# Patient Record
Sex: Male | Born: 2000 | Race: Black or African American | Hispanic: No | Marital: Single | Smoking: Never smoker
Health system: Southern US, Community
[De-identification: ages and names within clinical notes are randomized; demographics above are authoritative.]

## PROBLEM LIST (undated history)

## (undated) ENCOUNTER — Emergency Department (HOSPITAL_BASED_OUTPATIENT_CLINIC_OR_DEPARTMENT_OTHER): Payer: Medicaid Other

## (undated) DIAGNOSIS — R9431 Abnormal electrocardiogram [ECG] [EKG]: Secondary | ICD-10-CM

---
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
## 2021-09-11 ENCOUNTER — Emergency Department (HOSPITAL_BASED_OUTPATIENT_CLINIC_OR_DEPARTMENT_OTHER): Payer: Medicaid Other | Admitting: Radiology

## 2021-09-11 ENCOUNTER — Observation Stay (HOSPITAL_BASED_OUTPATIENT_CLINIC_OR_DEPARTMENT_OTHER)
Admission: EM | Admit: 2021-09-11 | Discharge: 2021-09-12 | Disposition: A | Discharge status: Home / Self Care | Payer: Medicaid Other | Attending: Emergency Medicine | Admitting: Emergency Medicine

## 2021-09-11 ENCOUNTER — Encounter (HOSPITAL_BASED_OUTPATIENT_CLINIC_OR_DEPARTMENT_OTHER): Payer: Self-pay | Admitting: Obstetrics and Gynecology

## 2021-09-11 ENCOUNTER — Other Ambulatory Visit: Payer: Self-pay

## 2021-09-11 ENCOUNTER — Emergency Department (HOSPITAL_BASED_OUTPATIENT_CLINIC_OR_DEPARTMENT_OTHER): Payer: Medicaid Other

## 2021-09-11 DIAGNOSIS — Z20822 Contact with and (suspected) exposure to covid-19: Secondary | ICD-10-CM | POA: Diagnosis not present

## 2021-09-11 DIAGNOSIS — R9431 Abnormal electrocardiogram [ECG] [EKG]: Secondary | ICD-10-CM | POA: Diagnosis not present

## 2021-09-11 DIAGNOSIS — R002 Palpitations: Secondary | ICD-10-CM | POA: Diagnosis present

## 2021-09-11 DIAGNOSIS — R079 Chest pain, unspecified: Secondary | ICD-10-CM | POA: Diagnosis present

## 2021-09-11 DIAGNOSIS — K5900 Constipation, unspecified: Secondary | ICD-10-CM | POA: Diagnosis not present

## 2021-09-11 DIAGNOSIS — E876 Hypokalemia: Secondary | ICD-10-CM

## 2021-09-11 DIAGNOSIS — F064 Anxiety disorder due to known physiological condition: Secondary | ICD-10-CM | POA: Diagnosis present

## 2021-09-11 DIAGNOSIS — I517 Cardiomegaly: Secondary | ICD-10-CM | POA: Diagnosis present

## 2021-09-11 HISTORY — DX: Abnormal electrocardiogram (ECG) (EKG): R94.31

## 2021-09-11 LAB — BASIC METABOLIC PANEL
Anion gap: 9 (ref 5–15)
BUN: 13 mg/dL (ref 6–20)
CO2: 28 mmol/L (ref 22–32)
Calcium: 10 mg/dL (ref 8.9–10.3)
Chloride: 101 mmol/L (ref 98–111)
Creatinine, Ser: 1.17 mg/dL (ref 0.61–1.24)
GFR, Estimated: >60 mL/min (ref >60)
Glucose, Bld: 99 mg/dL (ref 70–99)
Potassium: 3.3 mmol/L — ABNORMAL LOW (ref 3.5–5.1)
Sodium: 138 mmol/L (ref 135–145)

## 2021-09-11 LAB — RESP PANEL BY RT-PCR (FLU A&B, COVID) ARPGX2
Influenza A by PCR: NEGATIVE
Influenza B by PCR: NEGATIVE
SARS Coronavirus 2 by RT PCR: NEGATIVE

## 2021-09-11 LAB — URINALYSIS, ROUTINE W REFLEX MICROSCOPIC
Bilirubin Urine: NEGATIVE
Glucose, UA: NEGATIVE mg/dL
Hgb urine dipstick: NEGATIVE
Ketones, ur: NEGATIVE mg/dL
Leukocytes,Ua: NEGATIVE
Nitrite: NEGATIVE
Protein, ur: NEGATIVE mg/dL
Specific Gravity, Urine: 1.012 (ref 1.005–1.030)
pH: 7 (ref 5.0–8.0)

## 2021-09-11 LAB — CBC
HCT: 44.1 % (ref 39.0–52.0)
Hemoglobin: 13.9 g/dL (ref 13.0–17.0)
MCH: 24.9 pg — ABNORMAL LOW (ref 26.0–34.0)
MCHC: 31.5 g/dL (ref 30.0–36.0)
MCV: 78.9 fL — ABNORMAL LOW (ref 80.0–100.0)
Platelets: 240 10*3/uL (ref 150–400)
RBC: 5.59 MIL/uL (ref 4.22–5.81)
RDW: 13.2 % (ref 11.5–15.5)
WBC: 7.7 10*3/uL (ref 4.0–10.5)
nRBC: 0 % (ref 0.0–0.2)

## 2021-09-11 LAB — MAGNESIUM: Magnesium: 1.8 mg/dL (ref 1.7–2.4)

## 2021-09-11 LAB — TROPONIN I (HIGH SENSITIVITY)
Troponin I (High Sensitivity): <2 ng/L (ref <18)
Troponin I (High Sensitivity): <2 ng/L (ref <18)

## 2021-09-11 LAB — BRAIN NATRIURETIC PEPTIDE: B Natriuretic Peptide: 5.5 pg/mL (ref 0.0–100.0)

## 2021-09-11 NOTE — ED Provider Notes (Signed)
[place] EMERGENCY DEPT Provider Note   CSN: [id number] Arrival date & time: 09/11/21  1900     History Chief Complaint  Patient presents with   Chest Pain   Constipation    Miguel Johnson is a 20 y.o. male.  The history is provided by the patient and medical records. No language interpreter was used.  Palpitations Palpitations quality:  Fast Onset quality:  Sudden Duration:  10 minutes Timing:  Intermittent Progression:  Waxing and waning Chronicity:  Recurrent Context: anxiety   Relieved by:  Nothing Worsened by:  Nothing Ineffective treatments:  None tried Associated symptoms: diaphoresis and malaise/fatigue   Associated symptoms: no back pain, no chest pain, no chest pressure, no cough, no dizziness, no lower extremity edema, no nausea, no near-syncope, no shortness of breath and no vomiting   Risk factors: no diabetes mellitus, no hx of atrial fibrillation, no hx of DVT, no hx of PE and no hx of thyroid disease       History reviewed. No pertinent past medical history.  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    Past Surgical History:  Procedure Laterality Date   KNEE ARTHROSCOPY WITH DRILLING/MICROFRACTURE Left 11/21/2018   Procedure: MICROFRACTURE;  Surgeon: Renette Butters, MD;  Location: [place];  Service: Orthopedics;  Laterality: Left;   KNEE ARTHROSCOPY WITH MENISCAL REPAIR Left 11/21/2018   Procedure: KNEE ARTHROSCOPY WITH MEDIAL MENISCAL REPAIR;  Surgeon: Renette Butters, MD;  Location: [place];  Service: Orthopedics;  Laterality: Left;   ORCHIOPEXY Right 11/26/2018   Procedure: BILATERAL ORCHIOPEXY PEDIATRIC RIGHT TESTICULAR EXPLORATION;  Surgeon: Stanford Scotland, MD;  Location: [place];  Service: Pediatrics;  Laterality: Right;       No family history on file.  Social History   Tobacco Use   Smoking status: Never    Passive exposure: Never    Smokeless tobacco: Never  Vaping Use   Vaping Use: Never used  Substance Use Topics   Alcohol use: Never   Drug use: Not Currently    Home Medications Prior to Admission medications   Medication Sig Start Date End Date Taking? Authorizing Provider  ibuprofen (ADVIL,MOTRIN) 600 MG tablet Take 1 tablet (600 mg total) by mouth every 8 (eight) hours as needed for moderate pain. For pain / inflammation. 11/21/18   Prudencio Burly III, PA-C  ondansetron (ZOFRAN) 4 MG tablet Take 1 tablet (4 mg total) by mouth every 8 (eight) hours as needed for nausea or vomiting. 11/21/18   Prudencio Burly III, PA-C    Allergies    Patient has no known allergies.  Review of Systems   Review of Systems  Constitutional:  Positive for diaphoresis and malaise/fatigue. Negative for chills, fatigue and fever.  HENT:  Negative for congestion.   Eyes:  Negative for visual disturbance.  Respiratory:  Negative for cough, chest tightness, shortness of breath and wheezing.   Cardiovascular:  Positive for palpitations. Negative for chest pain, leg swelling and near-syncope.  Gastrointestinal:  Positive for constipation. Negative for abdominal pain, diarrhea, nausea and vomiting.  Genitourinary:  Negative for dysuria and flank pain.  Musculoskeletal:  Negative for back pain.  Neurological:  Positive for light-headedness. Negative for dizziness and headaches.  Psychiatric/Behavioral:  Negative for agitation and confusion.   All other systems reviewed and are negative.  Physical Exam Updated Vital Signs BP (!) 173/82   Pulse (!) 106   Temp  98.8 F (37.1 C)   Resp 16   Ht 6\' 1"  (1.854 m)   Wt 74.8 kg   SpO2 100%   BMI 21.76 kg/m   Physical Exam Vitals and nursing note reviewed.  Constitutional:      General: He is not in acute distress.    Appearance: He is well-developed. He is not ill-appearing, toxic-appearing or diaphoretic.  HENT:     Head: Normocephalic and atraumatic.     Nose:  No congestion or rhinorrhea.     Mouth/Throat:     Mouth: Mucous membranes are moist.     Pharynx: No oropharyngeal exudate or posterior oropharyngeal erythema.  Eyes:     Conjunctiva/sclera: Conjunctivae normal.  Cardiovascular:     Rate and Rhythm: Regular rhythm. Tachycardia present.     Heart sounds: No murmur heard. Pulmonary:     Effort: Pulmonary effort is normal. No respiratory distress.     Breath sounds: Normal breath sounds.  Abdominal:     Palpations: Abdomen is soft.     Tenderness: There is no abdominal tenderness. There is no guarding or rebound.  Musculoskeletal:        General: No tenderness.     Cervical back: Neck supple. No tenderness.  Skin:    General: Skin is warm and dry.     Capillary Refill: Capillary refill takes less than 2 seconds.     Findings: No erythema.  Neurological:     General: No focal deficit present.     Mental Status: He is alert.  Psychiatric:        Mood and Affect: Mood normal.    ED Results / Procedures / Treatments   Labs (all labs ordered are listed, but only abnormal results are displayed) Labs Reviewed  BASIC METABOLIC PANEL - Abnormal; Notable for the following components:      Result Value   Potassium 3.3 (*)    All other components within normal limits  CBC - Abnormal; Notable for the following components:   MCV 78.9 (*)    MCH 24.9 (*)    All other components within normal limits  URINALYSIS, ROUTINE W REFLEX MICROSCOPIC - Abnormal; Notable for the following components:   Color, Urine COLORLESS (*)    All other components within normal limits  RESP PANEL BY RT-PCR (FLU A&B, COVID) ARPGX2  BRAIN NATRIURETIC PEPTIDE  MAGNESIUM  TSH  TROPONIN I (HIGH SENSITIVITY)  TROPONIN I (HIGH SENSITIVITY)    EKG EKG Interpretation  Date/Time:  Monday September 11 2021 19:18:02 EST Ventricular Rate:  91 PR Interval:  152 QRS Duration: 112 QT Interval:  380 QTC Calculation: 467 R Axis:   84 Text Interpretation: Sinus  rhythm with marked sinus arrhythmia Abnormal ECG No STEMI per cardiology who reviewed with me on phone. Confirmed by 10-05-1989 (Theda Belfast) on 09/11/2021 8:56:21 PM  Radiology DG Chest Port 1 View  Result Date: 09/11/2021 CLINICAL DATA:  Palpitations EXAM: PORTABLE CHEST 1 VIEW COMPARISON:  None. FINDINGS: The heart size and mediastinal contours are within normal limits. Both lungs are clear. The visualized skeletal structures are unremarkable. IMPRESSION: No active disease. Electronically Signed   By: 09/13/2021 M.D.   On: 09/11/2021 19:51    Procedures Procedures   Medications Ordered in ED Medications - No data to display  ED Course  I have reviewed the triage vital signs and the nursing notes.  Pertinent labs & imaging results that were available during my care of the patient were reviewed by me  and considered in my medical decision making (see chart for details).    MDM Rules/Calculators/A&P                           SHYON SLAGTER is a 20 y.o. male with no significant past medical history who presents with palpitations.  According to patient, for the last month he has been having the episodes of palpitations frequently.  He reports it can last for several minutes at a time and mother reports that he has been having episodes of diaphoresis and fatigue with it.  Patient denies any chest pain but reports it "feels weird" when he is having them.  He denies any drug use, alcohol use, or medications.  They do say that a uncle of the patient passed away early from MI.  Patient denies any fevers, chills congestion, cough, nausea, vomiting, constipation, diarrhea, or urinary changes peer denies any leg pain or leg swelling.  On arrival, he had EKG that was abnormal and concerning.  I quickly called the STEMI cardiologist to review the EKG as it did not appear normal.  The cardiologist, Dr. Irish Lack, expressed concern that the EKG was in the normal but he did not suspect MI or STEMI at  this time.  He suspects the patient may have some other congenital cardiac problem like hypertrophic cardiomyopathy and he is concerned that the patient is having episodes of fast palpitations lightheadedness, and diaphoresis.  He does feel that after work-up was completed in the emergency department, he would benefit from admission to the cardiology service for echo and further monitoring and evaluation.  Will speak with the family and offer admission based on the cardiology recommendations based on the EKG.  Family agrees and cardiology agrees with admission.  Cardiology will admit for further work-up and management.  Final Clinical Impression(s) / ED Diagnoses Final diagnoses:  Abnormal ECG  Palpitations    Clinical Impression: 1. Abnormal ECG   2. Chest pain   3. Palpitations     Disposition: Admit  This note was prepared with assistance of Dragon voice recognition software. Occasional wrong-word or sound-a-like substitutions may have occurred due to the inherent limitations of voice recognition software.     Niccolas Loeper, Gwenyth Allegra, MD 09/12/21 [phone]

## 2021-09-11 NOTE — ED Notes (Signed)
Care Handoff given to Augusto Gamble, RN at this Time. All Questions Answered.

## 2021-09-11 NOTE — ED Triage Notes (Signed)
Patient reports he has constipation and palpitations x2 months. Patient reports his last BM was today.

## 2021-09-12 ENCOUNTER — Encounter (HOSPITAL_COMMUNITY): Payer: Self-pay | Admitting: Student in an Organized Health Care Education/Training Program

## 2021-09-12 ENCOUNTER — Inpatient Hospital Stay (HOSPITAL_COMMUNITY): Payer: Medicaid Other

## 2021-09-12 DIAGNOSIS — R9431 Abnormal electrocardiogram [ECG] [EKG]: Secondary | ICD-10-CM

## 2021-09-12 DIAGNOSIS — Z20822 Contact with and (suspected) exposure to covid-19: Secondary | ICD-10-CM | POA: Diagnosis not present

## 2021-09-12 DIAGNOSIS — E876 Hypokalemia: Secondary | ICD-10-CM

## 2021-09-12 DIAGNOSIS — R002 Palpitations: Secondary | ICD-10-CM | POA: Diagnosis not present

## 2021-09-12 DIAGNOSIS — R000 Tachycardia, unspecified: Secondary | ICD-10-CM | POA: Diagnosis not present

## 2021-09-12 DIAGNOSIS — K5900 Constipation, unspecified: Secondary | ICD-10-CM | POA: Diagnosis not present

## 2021-09-12 LAB — ECHOCARDIOGRAM COMPLETE
Area-P 1/2: 2.26 cm2
Calc EF: 55.7 %
Height: 73 in
S' Lateral: 3.5 cm
Single Plane A2C EF: 56.7 %
Single Plane A4C EF: 58.1 %
Weight: 2596.14 oz

## 2021-09-12 LAB — HIV ANTIBODY (ROUTINE TESTING W REFLEX): HIV Screen 4th Generation wRfx: NONREACTIVE

## 2021-09-12 LAB — TSH: TSH: 0.864 u[IU]/mL (ref 0.350–4.500)

## 2021-09-12 MED ORDER — ENSURE ENLIVE PO LIQD
237.0000 mL | Freq: Two times a day (BID) | ORAL | Status: DC
  Administered 2021-09-12: 237 mL via ORAL

## 2021-09-12 MED ORDER — POLYETHYLENE GLYCOL 3350 17 G PO PACK
17.0000 g | PACK | Freq: Every day | ORAL | Status: DC
  Administered 2021-09-12: 17 g via ORAL
  Filled 2021-09-12: qty 1

## 2021-09-12 MED ORDER — SENNOSIDES-DOCUSATE SODIUM 8.6-50 MG PO TABS
1.0000 | ORAL_TABLET | Freq: Two times a day (BID) | ORAL | Status: DC
  Administered 2021-09-12: 1 via ORAL
  Filled 2021-09-12: qty 1

## 2021-09-12 MED ORDER — POTASSIUM CHLORIDE CRYS ER 20 MEQ PO TBCR
40.0000 meq | EXTENDED_RELEASE_TABLET | Freq: Once | ORAL | Status: AC
  Administered 2021-09-12: 40 meq via ORAL
  Filled 2021-09-12: qty 2

## 2021-09-12 MED ORDER — ADULT MULTIVITAMIN W/MINERALS CH
1.0000 | ORAL_TABLET | Freq: Every day | ORAL | Status: DC
  Administered 2021-09-12: 1 via ORAL
  Filled 2021-09-12: qty 1

## 2021-09-12 NOTE — Progress Notes (Signed)
Initial Nutrition Assessment  DOCUMENTATION CODES:  Not applicable  INTERVENTION:  Continue Ensure BID.  Add Magic cup TID with meals, each supplement provides 290 kcal and 9 grams of protein.  Add MVI with minerals daily.  Encourage PO and supplement intake.  NUTRITION DIAGNOSIS:  Inadequate oral intake related to decreased appetite as evidenced by per patient/family report.  GOAL:  Patient will meet greater than or equal to 90% of their needs  MONITOR:  PO intake, Supplement acceptance, Labs, Weight trends, Skin, I & O's  REASON FOR ASSESSMENT:  Malnutrition Screening Tool    ASSESSMENT:  20 yo male with no significant PMH who is admitted for evaluation of palpitations and LVH on ECG.  Spoke with pt at bedside, along with his mother. Pt reports that his appetite has been a little less recently and he has been losing weight because he has not been eating as much. Mother confirmed this.  Mother mentioned rash on patient's chest. RD took a look at mother's request. They appear to be white bumps that per mother, "come and go." Recommended dermatologist for care.  Pt endorses some weight loss, but is unsure of how much and over how long.   Per Epic, pt has lost ~13 lbs (7.3%) in the last 13 months, which is not necessarily significant for the time frame.  He reports disliking the hospital food, so encouraged intake of Ensure and suggested Magic Cup. Also recommend MVI with minerals daily.  Supplements: Ensure BID  Medications: reviewed; miralax, Klor-Con 40 mEq once, Senokot BID  Labs: reviewed; K 3.3 (L)  NUTRITION - FOCUSED PHYSICAL EXAM: Flowsheet Row Most Recent Value  Orbital Region No depletion  Upper Arm Region No depletion  Thoracic and Lumbar Region No depletion  Buccal Region No depletion  Temple Region No depletion  Clavicle Bone Region No depletion  Clavicle and Acromion Bone Region No depletion  Scapular Bone Region No depletion  Dorsal Hand No  depletion  Patellar Region No depletion  Anterior Thigh Region No depletion  Posterior Calf Region Mild depletion  Edema (RD Assessment) None  Hair Reviewed  Eyes Reviewed  Mouth Reviewed  Skin Reviewed  Nails Reviewed   Diet Order:   Diet Order             Diet Heart Room service appropriate? Yes; Fluid consistency: Thin  Diet effective now                  EDUCATION NEEDS:  Education needs have been addressed  Skin:  Skin Assessment: Reviewed RN Assessment  Last BM:  09/11/21  Height:  Ht Readings from Last 1 Encounters:  09/12/21 6\' 1"  (1.854 m) (89 %, Z= 1.24)*   * Growth percentiles are based on CDC (Boys, 2-20 Years) data.   Weight:  Wt Readings from Last 1 Encounters:  09/12/21 73.6 kg (65 %, Z= 0.37)*   * Growth percentiles are based on CDC (Boys, 2-20 Years) data.   BMI:  Body mass index is 21.41 kg/m.  Estimated Nutritional Needs:  Kcal:  2300-2500 Protein:  95-110 grams Fluid:  >2.3 L  09/14/21, RD, LDN (she/her/hers) Clinical Inpatient Dietitian RD Pager/After-Hours/Weekend Pager # in [place]

## 2021-09-12 NOTE — Progress Notes (Signed)
Stat EKG done per provider order, EKG showed STEMI , patient asymptomatic. Md made aware.     09/12/21 0424  Vitals  Temp 97.8 F (36.6 C)  Temp Source Oral  BP 134/82  MAP (mmHg) 97  BP Location Left Arm  BP Method Automatic  Patient Position (if appropriate) Lying  Pulse Rate 67  Pulse Rate Source Monitor  Resp 20

## 2021-09-12 NOTE — Plan of Care (Signed)

## 2021-09-12 NOTE — Progress Notes (Signed)
  Cross cover note: See fellow H/P this AM. 2D echo pending for this morning. K  3.3 on admission - not yet repleted, will give x1. EKG remains similar to admit tracing -> troponins negative and BNP wnl. Telemetry shows NSR/SB 50s-60s. Await MD review as well. Ferrah Panagopoulos PA-C

## 2021-09-12 NOTE — Progress Notes (Signed)
    Subjective:  Denies SSCP, palpitations or Dyspnea   Objective:  Vitals:   09/12/21 0215 09/12/21 0306 09/12/21 0424 09/12/21 0741  BP: 116/71 137/73 134/82 128/70  Pulse: 60 77 67 (!) 44  Resp: 13 16 20    Temp: 98.3 F (36.8 C) 98.5 F (36.9 C) 97.8 F (36.6 C) 97.9 F (36.6 C)  TempSrc: Oral Oral Oral Oral  SpO2: 99% 100% 100% 100%  Weight:  73.6 kg    Height:  6\' 1"  (1.854 m)      Intake/Output from previous day:  Intake/Output Summary (Last 24 hours) at 09/12/2021 0854 Last data filed at 09/12/2021 0500 Gross per 24 hour  Intake 480 ml  Output --  Net 480 ml    Physical Exam: Affect appropriate Thin black male  HEENT: normal Neck supple with no adenopathy JVP normal no bruits no thyromegaly Lungs clear with no wheezing and good diaphragmatic motion Heart:  S1/S2 no murmur, no rub, gallop or click PMI normal Abdomen: benighn, BS positve, no tenderness, no AAA no bruit.  No HSM or HJR Distal pulses intact with no bruits No edema Neuro non-focal Skin warm and dry No muscular weakness   Lab Results: Basic Metabolic Panel: Recent Labs    09/11/21 1924  NA 138  K 3.3*  CL 101  CO2 28  GLUCOSE 99  BUN 13  CREATININE 1.17  CALCIUM 10.0  MG 1.8   Liver Function Tests: No results for input(s): AST, ALT, ALKPHOS, BILITOT, PROT, ALBUMIN in the last 72 hours. No results for input(s): LIPASE, AMYLASE in the last 72 hours. CBC: Recent Labs    09/11/21 1924  WBC 7.7  HGB 13.9  HCT 44.1  MCV 78.9*  PLT 240    Thyroid Function Tests: Recent Labs    09/11/21 1924  TSH 0.864   Anemia Panel: No results for input(s): VITAMINB12, FOLATE, FERRITIN, TIBC, IRON, RETICCTPCT in the last 72 hours.  Imaging: DG Chest Port 1 View  Result Date: 09/11/2021 CLINICAL DATA:  Palpitations EXAM: PORTABLE CHEST 1 VIEW COMPARISON:  None. FINDINGS: The heart size and mediastinal contours are within normal limits. Both lungs are clear. The visualized skeletal  structures are unremarkable. IMPRESSION: No active disease. Electronically Signed   By: 09/13/21 M.D.   On: 09/11/2021 19:51    Cardiac Studies:  ECG: SR early repolarization likely normal variant no old ECG to compare   Telemetry:  Palpitations   Echo:   Medications:    feeding supplement  237 mL Oral BID BM   polyethylene glycol  17 g Oral Daily   potassium chloride  40 mEq Oral Once   senna-docusate  1 tablet Oral BID      Assessment/Plan:   Abnormal ECG:  likely normal variant for patient TTE pending to r/o HOCM Normal exam and cardiac [place] on CXR No high risk family history per patient and mother Will not need further w/u if echo normal  Charlett Nose 09/12/2021, 8:54 AM

## 2021-09-12 NOTE — Plan of Care (Signed)

## 2021-09-12 NOTE — H&P (Signed)
Cardiology Admission History and Physical:   Patient ID: Miguel Johnson MRN: [id number]; DOB: 2001-02-24   Admission date: 09/11/2021  PCP:  Venia Minks Jodelle Gross, MD   [facility] Providers Cardiologist:  None        Chief Complaint: Constipation  Patient Profile:   Miguel Johnson is a 20 y.o. male with no significant past medical history who is being seen 09/12/2021 for the evaluation of palpitations and LVH on ECG.  History of Present Illness:   Miguel Johnson the patient reports that he has been dealing with constipation off and on for a couple weeks which was actually the main reason why he presented to the outside ED.  However, for the past month he has been having episodes of palpitations that he attributes to "anxiety."  He states that these episodes come on spontaneously and last for a few seconds to a couple minutes before subsiding.  These episodes occur 1-2 times daily over the past month.  There are no appreciable triggers; however, he does endorse having increased anxiety due to life stressors.  He denies associated chest pain, shortness of breath, syncope, presyncope, weakness, numbness, nausea, vomiting, diaphoresis, or urinary symptoms.  On arrival to the ED he shared that he had been having these palpitations with the ED provider and EKG was obtained demonstrating ST segment elevations and marked LVH.  The overnight interventionalists was notified and did not feel this represented a STEMI; however, given the peculiarity of the EKG he was transferred to [facility] for observation.  Of note the patient denies having any family history of sudden cardiac death or cardiovascular disease in young family members.  His mother's uncle did die of an MI however at an older age.  His labs on admission were unremarkable.   History reviewed. No pertinent past medical history.  Past Surgical History:  Procedure Laterality Date   KNEE ARTHROSCOPY WITH DRILLING/MICROFRACTURE Left  11/21/2018   Procedure: MICROFRACTURE;  Surgeon: Sheral Apley, MD;  Location: [facility];  Service: Orthopedics;  Laterality: Left;   KNEE ARTHROSCOPY WITH MENISCAL REPAIR Left 11/21/2018   Procedure: KNEE ARTHROSCOPY WITH MEDIAL MENISCAL REPAIR;  Surgeon: Sheral Apley, MD;  Location: [facility];  Service: Orthopedics;  Laterality: Left;   ORCHIOPEXY Right 11/26/2018   Procedure: BILATERAL ORCHIOPEXY PEDIATRIC RIGHT TESTICULAR EXPLORATION;  Surgeon: Kandice Hams, MD;  Location: [facility] OR;  Service: Pediatrics;  Laterality: Right;     Medications Prior to Admission: Prior to Admission medications   Medication Sig Start Date End Date Taking? Authorizing Provider  ibuprofen (ADVIL,MOTRIN) 600 MG tablet Take 1 tablet (600 mg total) by mouth every 8 (eight) hours as needed for moderate pain. For pain / inflammation. 11/21/18   Albina Billet III, PA-C  ondansetron (ZOFRAN) 4 MG tablet Take 1 tablet (4 mg total) by mouth every 8 (eight) hours as needed for nausea or vomiting. 11/21/18   Albina Billet III, PA-C     Allergies:   No Known Allergies  Social History:   Social History   Socioeconomic History   Marital status: Single    Spouse name: Not on file   Number of children: Not on file   Years of education: Not on file   Highest education level: Not on file  Occupational History   Not on file  Tobacco Use   Smoking status: Never    Passive exposure: Never   Smokeless tobacco: Never  Vaping Use   Vaping  Use: Never used  Substance and Sexual Activity   Alcohol use: Never   Drug use: Not Currently   Sexual activity: Yes  Other Topics Concern   Not on file  Social History Narrative   Not on file   Social Determinants of Health   Financial Resource Strain: Not on file  Food Insecurity: Not on file  Transportation Needs: Not on file  Physical Activity: Not on file  Stress: Not on file  Social Connections: Not on file   Intimate Partner Violence: Not on file    Family History:   The patient's family history is not on file.    ROS:  Please see the history of present illness.  All other ROS reviewed and negative.     Physical Exam/Data:   Vitals:   09/11/21 2345 09/12/21 0100 09/12/21 0215 09/12/21 0306  BP: 122/62 (!) 144/89 116/71 137/73  Pulse: (!) 54 68 60 77  Resp: 18 16 13 16   Temp: 98.3 F (36.8 C)  98.3 F (36.8 C) 98.5 F (36.9 C)  TempSrc: Oral  Oral Oral  SpO2: 100% 100% 99% 100%  Weight:    73.6 kg  Height:    6\' 1"  (1.854 m)    Intake/Output Summary (Last 24 hours) at 09/12/2021 0402 Last data filed at 09/12/2021 0300 Gross per 24 hour  Intake 240 ml  Output --  Net 240 ml   Last 3 Weights 09/12/2021 09/11/2021 08/08/2020  Weight (lbs) 162 lb 4.1 oz 164 lb 14.5 oz 175 lb 7.8 oz  Weight (kg) 73.6 kg 74.8 kg 79.6 kg     Body mass index is 21.41 kg/m.  General:  Well nourished, well developed, in no acute distress HEENT: normal Neck: no JVD Vascular: No carotid bruits; Distal pulses 2+ bilaterally   Cardiac:  normal S1, S2; RRR; no murmur  Lungs:  clear to auscultation bilaterally, no wheezing, rhonchi or rales  Abd: soft, nontender, no hepatomegaly  Ext: no edema Musculoskeletal:  No deformities, BUE and BLE strength normal and equal Skin: warm and dry  Neuro:  CNs 2-12 intact, no focal abnormalities noted Psych:  Normal affect    EKG:  The ECG that was done  was personally reviewed and demonstrates marked LVH with diffuse ST segment elevations    Relevant CV Studies:  N/A  Laboratory Data:  High Sensitivity Troponin:   Recent Labs  Lab 09/11/21 1924 09/11/21 2208  TROPONINIHS <2 <2      Chemistry Recent Labs  Lab 09/11/21 1924  NA 138  K 3.3*  CL 101  CO2 28  GLUCOSE 99  BUN 13  CREATININE 1.17  CALCIUM 10.0  MG 1.8  GFRNONAA >60  ANIONGAP 9    No results for input(s): PROT, ALBUMIN, AST, ALT, ALKPHOS, BILITOT in the last 168  hours. Lipids No results for input(s): CHOL, TRIG, HDL, LABVLDL, LDLCALC, CHOLHDL in the last 168 hours. Hematology Recent Labs  Lab 09/11/21 1924  WBC 7.7  RBC 5.59  HGB 13.9  HCT 44.1  MCV 78.9*  MCH 24.9*  MCHC 31.5  RDW 13.2  PLT 240   Thyroid  Recent Labs  Lab 09/11/21 1924  TSH 0.864   BNP Recent Labs  Lab 09/11/21 1924  BNP 5.5    DDimer No results for input(s): DDIMER in the last 168 hours.   Radiology/Studies:  DG Chest Port 1 View  Result Date: 09/11/2021 CLINICAL DATA:  Palpitations EXAM: PORTABLE CHEST 1 VIEW COMPARISON:  None. FINDINGS: The  heart size and mediastinal contours are within normal limits. Both lungs are clear. The visualized skeletal structures are unremarkable. IMPRESSION: No active disease. Electronically Signed   By: Charlett Nose M.D.   On: 09/11/2021 19:51     Assessment and Plan:   Miguel Johnson is a 20 y.o. male with no significant past medical history who is being seen 09/12/2021 for the evaluation of palpitations and LVH on ECG.  #Palpitations :: Patient reports having intermittent palpitations up to twice daily over the past 1 month.  Unclear if this represents an SVT or anxiety as the patient endorses.  I think is reasonable for [place] to monitor him on telemetry overnight and then send him home with a Holter monitor for ongoing evaluation.  Currently stable and no other concerns from rhythm standpoint. -Holter monitor at discharge   #Athletes Heart vs. HOCM :: His EKG has marked LVH and repolarization abnormalities with diffuse ST elevations.  I think that this represents an EKG reflective of an athlete's heart given that he is young, athletic, and has no family history of early onset SCD to make me think that he is at risk for HOCM.  Nonetheless, given the findings on his EKG and his palpitations, it is certainly reasonable to get a TTE to assess for HOCM or some structural abnormality.  We will get a TTE today and remain on  telemetry as above. -TTE  #Constipation -Start Senokot twice daily and MiraLAX   Risk Assessment/Risk Scores:           Severity of Illness: The appropriate patient status for this patient is OBSERVATION. Observation status is judged to be reasonable and necessary in order to provide the required intensity of service to ensure the patient's safety. The patient's presenting symptoms, physical exam findings, and initial radiographic and laboratory data in the context of their medical condition is felt to place them at decreased risk for further clinical deterioration. Furthermore, it is anticipated that the patient will be medically stable for discharge from the hospital within 2 midnights of admission.    For questions or updates, please contact [facility] Please consult [email or web address] for contact info under     Signed, Karl Ito, MD  09/12/2021 4:02 AM

## 2021-09-12 NOTE — Discharge Summary (Signed)
Discharge Summary    Patient ID: TRENTIN Johnson MRN: [id number]; DOB: 11/20/2000  Admit date: 09/11/2021 Discharge date: 09/12/2021  PCP:  Ether Griffins, MD   [facility] Providers Cardiologist:  Charlton Haws, MD        Discharge Diagnoses    Principal Problem:   Nonspecific abnormal electrocardiogram (ECG) (EKG) Active Problems:   Palpitations   Constipation   Hypokalemia    Diagnostic Studies/Procedures    2D echo formal read pending. Per Dr. Fabio Bering preliminary read: no significant abnormalities. Report to follow. _____________   History of Present Illness     Miguel Johnson is a 20 y.o. male with no significant PMH who presented initially to [facility] with intermittent constipation on and off the last few months. He was incidentally found to have an abnormal EKG. He had reported episodic palpitations that he attributed to "anxiety."  He stated that these episodes would come on spontaneously and last for a few seconds to a couple minutes before subsiding.  These episodes would occur 1-2 times daily over the past month. There were no appreciable triggers; however, he did endorse having increased anxiety due to life stressors. He denied associated chest pain, shortness of breath, syncope, presyncope, weakness, numbness, nausea, vomiting, diaphoresis, or urinary symptoms. On arrival to the ED he shared that he had been having these palpitations with the ED provider and EKG was obtained demonstrating ST segment elevations and marked LVH.  The overnight interventionalists was notified and did not feel this represented a STEMI; however, given the peculiarity of the EKG he was transferred to [facility] for observation. The patient denied having any family history of sudden cardiac death or cardiovascular disease in young family members. His mother's uncle did die of an MI however at an older age. His labs on admission were unremarkable except for mildly low potassium  level of 3.3, prompting oral repletion.  Hospital Course     He was admitted to the cardiology service for further observation. Troponins were negative. Telemetry remained completely unremarkable overnight, normal sinus rhythm. 2D echocardiogram was obtained which Dr. Eden Emms reviewed and felt did not show any significant abnormalities. Formal report pending. He did not feel any further cardiac testing or follow-up was needed at this time. He did not feel palpitations required further workup given normal telemetry. The patient was encouraged to f/u primary care. Dr. Eden Emms has seen and examined the patient today and feels he is stable for discharge.    Did the patient have an acute coronary syndrome (MI, NSTEMI, STEMI, etc) this admission?:  No                               Did the patient have a percutaneous coronary intervention (stent / angioplasty)?:  No.      _____________  Discharge Vitals Blood pressure 128/70, pulse - listed as 44 in VS but last HR 62bpm - temperature 97.9 F (36.6 C), temperature source Oral, resp. rate 20, height 6\' 1"  (1.854 m), weight 73.6 kg, SpO2 100 %.  Filed Weights   09/11/21 1914 09/12/21 0306  Weight: 74.8 kg 73.6 kg    Labs & Radiologic Studies    CBC Recent Labs    09/11/21 1924  WBC 7.7  HGB 13.9  HCT 44.1  MCV 78.9*  PLT 240   Basic Metabolic Panel Recent Labs    09/13/21 1924  NA 138  K 3.3*  CL  101  CO2 28  GLUCOSE 99  BUN 13  CREATININE 1.17  CALCIUM 10.0  MG 1.8   High Sensitivity Troponin:   Recent Labs  Lab 09/11/21 1924 09/11/21 2208  TROPONINIHS <2 <2    Thyroid Function Tests Recent Labs    09/11/21 1924  TSH 0.864   _____________  DG Chest Port 1 View  Result Date: 09/11/2021 CLINICAL DATA:  Palpitations EXAM: PORTABLE CHEST 1 VIEW COMPARISON:  None. FINDINGS: The heart size and mediastinal contours are within normal limits. Both lungs are clear. The visualized skeletal structures are unremarkable.  IMPRESSION: No active disease. Electronically Signed   By: Charlett Nose M.D.   On: 09/11/2021 19:51   Disposition   Pt is being discharged home today in good condition.  Follow-up Plans & Appointments     Follow-up Information     Pritt, Jodelle Gross, MD Follow up.   Specialty: Pediatrics Why: Please follow up with your primary care provider for routine follow-up after discharge and for your constipation. Your potassium level was also slightly low when you arrived to the hospital and you should consider having this rechecked when you see primary care. Contact information: [address] [place] [address] [phone]         Wendall Stade, MD Follow up.   Specialty: Cardiology Why: If you have any questions about your cardiac care, you may call Dr. Fabio Bering office. Contact information: [address] [place] [address] [phone]                Discharge Instructions     Discharge instructions   Complete by: As directed    For your constipation, you may use over-the-counter Miralax or Senokot as directed per package instructions.   Increase activity slowly   Complete by: As directed        Discharge Medications   Allergies as of 09/12/2021   No Known Allergies      Medication List     TAKE these medications    acetaminophen 500 MG tablet Commonly known as: TYLENOL Take 1,000 mg by mouth every 6 (six) hours as needed for mild pain or headache.   SODIUM FLUORIDE (DENTAL RINSE) 0.2 % Soln Use as directed 15 mLs in the mouth or throat at bedtime.           Outstanding Labs/Studies   N/A  Duration of Discharge Encounter   Greater than 30 minutes including physician time.  Signed, Laurann Montana, PA-C 09/12/2021, 2:19 PM

## 2021-09-12 NOTE — ED Notes (Signed)
Report given to [facility] 

## 2021-09-12 NOTE — Progress Notes (Signed)
Order to discharge pt home.  Discharge instructions/AVS given to patient and reviewed - education provided as needed.  Pt advised to call PCP and/or come back to the hospital if there are any problems. Pt verbalized understanding.    

## 2021-09-15 ENCOUNTER — Telehealth: Payer: Self-pay | Admitting: Cardiovascular Disease

## 2021-09-15 NOTE — Telephone Encounter (Signed)
Patient's mother called to schedule hospital follow up appt. I wasn't able to find anything until February 15th. Please help in finding sooner appt for patient.

## 2021-09-15 NOTE — Telephone Encounter (Signed)
Left a message for the pts mother to call back re: an appt.

## 2021-09-19 NOTE — Telephone Encounter (Signed)
Pt has been scheduled s/p hosp.11/28 with Miguel Johnson

## 2021-09-25 ENCOUNTER — Other Ambulatory Visit (HOSPITAL_BASED_OUTPATIENT_CLINIC_OR_DEPARTMENT_OTHER): Payer: Self-pay

## 2021-09-25 ENCOUNTER — Ambulatory Visit (INDEPENDENT_AMBULATORY_CARE_PROVIDER_SITE_OTHER): Payer: Medicaid Other | Admitting: Nurse Practitioner

## 2021-09-25 ENCOUNTER — Encounter (HOSPITAL_BASED_OUTPATIENT_CLINIC_OR_DEPARTMENT_OTHER): Payer: Self-pay | Admitting: General Practice

## 2021-09-25 ENCOUNTER — Other Ambulatory Visit: Payer: Self-pay

## 2021-09-25 ENCOUNTER — Ambulatory Visit (INDEPENDENT_AMBULATORY_CARE_PROVIDER_SITE_OTHER): Payer: Medicaid Other

## 2021-09-25 VITALS — BP 122/68 | HR 59 | Ht 73.0 in | Wt 169.5 lb

## 2021-09-25 DIAGNOSIS — E876 Hypokalemia: Secondary | ICD-10-CM | POA: Diagnosis not present

## 2021-09-25 DIAGNOSIS — R002 Palpitations: Secondary | ICD-10-CM | POA: Diagnosis not present

## 2021-09-25 DIAGNOSIS — R9431 Abnormal electrocardiogram [ECG] [EKG]: Secondary | ICD-10-CM | POA: Diagnosis not present

## 2021-09-25 MED ORDER — PROPRANOLOL HCL 10 MG PO TABS: 10.0000 mg | ORAL_TABLET | ORAL | 3 refills | Status: DC | PRN

## 2021-09-25 MED ORDER — PROPRANOLOL HCL 10 MG PO TABS
10.0000 mg | ORAL_TABLET | ORAL | 3 refills | Status: DC | PRN
  Filled 2021-09-25: qty 90, fill #0

## 2021-09-25 NOTE — Patient Instructions (Signed)
Medication Instructions:  Your Physician recommend you continue on your current medication as directed.    *If you need a refill on your cardiac medications before your next appointment, please call your pharmacy*   Lab Work: Your physician recommends that you return for lab work in: Today- BMP  If you have labs (blood work) drawn today and your tests are completely normal, you will receive your results only by: MyChart Message (if you have MyChart) OR A paper copy in the mail If you have any lab test that is abnormal or we need to change your treatment, we will call you to review the results.   Testing/Procedures: Your physician has recommended that you wear a Zio monitor.   This monitor is a medical device that records the heart's electrical activity. Doctors most often use these monitors to diagnose arrhythmias. Arrhythmias are problems with the speed or rhythm of the heartbeat. The monitor is a small device applied to your chest. You can wear one while you do your normal daily activities. While wearing this monitor if you have any symptoms to push the button and record what you felt. Once you have worn this monitor for the period of time provider prescribed (Usually 14 days), you will return the monitor device in the postage paid box. Once it is returned they will download the data collected and provide [place] with a report which the provider will then review and we will call you with those results. Important tips:  Avoid showering during the first 24 hours of wearing the monitor. Avoid excessive sweating to help maximize wear time. Do not submerge the device, no hot tubs, and no swimming pools. Keep any lotions or oils away from the patch. After 24 hours you may shower with the patch on. Take brief showers with your back facing the shower head.  Do not remove patch once it has been placed because that will interrupt data and decrease adhesive wear time. Push the button when you have any  symptoms and write down what you were feeling. Once you have completed wearing your monitor, remove and place into box which has postage paid and place in your outgoing mailbox.  If for some reason you have misplaced your box then call our office and we can provide another box and/or mail it off for you.      Follow-Up: At [facility], you and your health needs are our priority.  As part of our continuing mission to provide you with exceptional heart care, we have created designated Provider Care Teams.  These Care Teams include your primary Cardiologist (physician) and Advanced Practice Providers (APPs -  Physician Assistants and Nurse Practitioners) who all work together to provide you with the care you need, when you need it.  We recommend signing up for the patient portal called "MyChart".  Sign up information is provided on this After Visit Summary.  MyChart is used to connect with patients for Virtual Visits (Telemedicine).  Patients are able to view lab/test results, encounter notes, upcoming appointments, etc.  Non-urgent messages can be sent to your provider as well.   To learn more about what you can do with MyChart, go to [email or web address].    Your next appointment:   1-2 month(s)  The format for your next appointment:   In Person  Provider:   Charlton Haws, MD  or APP

## 2021-09-25 NOTE — Progress Notes (Signed)
Cardiology Office Note:    Date:  09/25/2021   ID:  Miguel Johnson, DOB 2001-01-28, MRN [id number]  PCP:  Pritt, Jodelle Gross, MD   [facility] Providers Cardiologist:  Charlton Haws, MD     Referring MD: Pritt, Jodelle Gross, MD   Chief Complaint: hospital f/u palpitations, abnormal EKG  History of Present Illness:    Miguel Johnson is a 20 y.o. male with a hx of palpitations, abnormal EKG, hypokalemia, and anxiety with no prior cardiac history. He presented to the [facility] ED on 09/11/21 for evaluation of palpitations with diaphoresis and fatigue x 1 month. He had an abnormal EKG concerning for STEMI and marked LVH which was reviewed by Dr. Eldridge Dace, cardiologist. It was recommended that he stay overnight for observation. Troponins were negative and telemetry was completely unremarkable. 2D Echo was unremarkable and he was advised to follow-up with primary care.   Today, he is here with his grandmother. He reports that palpitations are not occurring with as much frequency or intensity as when he was hospitalized on 09/11/21, but he continues to have them occur almost daily. They typically resolve within a few seconds without intervention. He denies chest pain, SOB, fatigue dizziness, lightheadedness, syncope, orthopnea, PND, or edema. He reports good hydration and nutrition and denies use of caffeine, alcohol, tobacco, or drugs. His grandmother states that he was living in a very stressful environment at home and she has moved him to her house where there is no stress. He is studying Pension scheme manager.    Past Medical History:  Diagnosis Date   Abnormal EKG     Past Surgical History:  Procedure Laterality Date   KNEE ARTHROSCOPY WITH DRILLING/MICROFRACTURE Left 11/21/2018   Procedure: MICROFRACTURE;  Surgeon: Sheral Apley, MD;  Location: [facility];  Service: Orthopedics;  Laterality: Left;   KNEE ARTHROSCOPY WITH MENISCAL REPAIR Left 11/21/2018   Procedure: KNEE ARTHROSCOPY  WITH MEDIAL MENISCAL REPAIR;  Surgeon: Sheral Apley, MD;  Location: [facility];  Service: Orthopedics;  Laterality: Left;   ORCHIOPEXY Right 11/26/2018   Procedure: BILATERAL ORCHIOPEXY PEDIATRIC RIGHT TESTICULAR EXPLORATION;  Surgeon: Kandice Hams, MD;  Location: [facility];  Service: Pediatrics;  Laterality: Right;    Current Medications: Current Meds  Medication Sig   [DISCONTINUED] propranolol (INDERAL) 10 MG tablet Take 1 tablet (10 mg total) by mouth as needed (Please take as needed for palpitations up to 4 times per day).     Allergies:   Patient has no known allergies.   Social History   Socioeconomic History   Marital status: Single    Spouse name: Not on file   Number of children: Not on file   Years of education: Not on file   Highest education level: Not on file  Occupational History   Not on file  Tobacco Use   Smoking status: Never    Passive exposure: Never   Smokeless tobacco: Never  Vaping Use   Vaping Use: Never used  Substance and Sexual Activity   Alcohol use: Never   Drug use: Not Currently   Sexual activity: Yes  Other Topics Concern   Not on file  Social History Narrative   Not on file   Social Determinants of Health   Financial Resource Strain: Not on file  Food Insecurity: Not on file  Transportation Needs: Not on file  Physical Activity: Not on file  Stress: Not on file  Social Connections: Not on file  Family History: The patient's family history includes Heart attack in an other family member; Heart murmur in his maternal grandfather.  ROS:   Please see the history of present illness.  All other systems reviewed and are negative.  Labs/Other Studies Reviewed:    The following studies were reviewed today:   Echo 09/12/21  Left Ventricle: Left ventricular ejection fraction, by estimation, is 55  to 60%. The left ventricle has normal function. The left ventricle has no  regional wall motion abnormalities. The  left ventricular internal cavity  size was normal in size. There is no left ventricular hypertrophy. Left ventricular diastolic parameters were normal.  Right Ventricle: The right ventricular size is normal. No increase in  right ventricular wall thickness. Right ventricular systolic function is  normal. Tricuspid regurgitation signal is inadequate for assessing PA  pressure.  Left Atrium: Left atrial size was normal in size.  Right Atrium: Right atrial size was normal in size.  Pericardium: There is no evidence of pericardial effusion.  Mitral Valve: The mitral valve is normal in structure. No evidence of  mitral valve regurgitation. No evidence of mitral valve stenosis.  Tricuspid Valve: The tricuspid valve is normal in structure. Tricuspid  valve regurgitation is trivial. No evidence of tricuspid stenosis.  Aortic Valve: The aortic valve is tricuspid. Aortic valve regurgitation is  not visualized.   Pulmonic Valve: The pulmonic valve was normal in structure. Pulmonic valve  regurgitation is not visualized. No evidence of pulmonic stenosis.   Aorta: The aortic root and ascending aorta are structurally normal, with  no evidence of dilitation.   Venous: The inferior vena cava is dilated in size with greater than 50%  respiratory variability, suggesting right atrial pressure of 8 mmHg.    Recent Labs: 09/11/2021: B Natriuretic Peptide 5.5; BUN 13; Creatinine, Ser 1.17; Hemoglobin 13.9; Magnesium 1.8; Platelets 240; Potassium 3.3; Sodium 138; TSH 0.864  Recent Lipid Panel No results found for: CHOL, TRIG, HDL, CHOLHDL, VLDL, LDLCALC, LDLDIRECT    Physical Exam:    VS:  BP 122/68   Pulse (!) 59   Ht 6\' 1"  (1.854 m)   Wt 169 lb 8 oz (76.9 kg)   BMI 22.36 kg/m     Wt Readings from Last 3 Encounters:  09/25/21 169 lb 8 oz (76.9 kg) (73 %, Z= 0.62)*  09/12/21 162 lb 4.1 oz (73.6 kg) (65 %, Z= 0.37)*  08/08/20 175 lb 7.8 oz (79.6 kg) (83 %, Z= 0.97)*   * Growth percentiles are  based on CDC (Boys, 2-20 Years) data.     GEN:  Well nourished, well developed in no acute distress HEENT: Normal NECK: No JVD; No carotid bruits LYMPHATICS: No lymphadenopathy CARDIAC: RRR, no murmurs, rubs, gallops RESPIRATORY:  Clear to auscultation without rales, wheezing or rhonchi  ABDOMEN: Soft, non-tender, non-distended MUSCULOSKELETAL:  No edema; No deformity  SKIN: Warm and dry NEUROLOGIC:  Alert and oriented x 3 PSYCHIATRIC:  Normal affect   EKG:  EKG is  ordered today.  The ekg ordered today demonstrates sinus bradycardia at rate of 59 bpm with ST elevation in V2-V6; no acute change from previous EKG  Diagnoses:    1. Palpitations   2. Abnormal EKG   3. Hypokalemia    Assessment and Plan:    Palpitations: He continues to have palpitations almost daily. He feels that the intensity and frequency has lessened since hospital discharge on 09/12/21 but they still concern him. Will place 7 day cardiac monitor for potential arrhythmia  and try propranolol 10 mg up to 4 times per day as needed.   Abnormal EKG: EKG today reveals marked ST elevation in the precordial leads, with no acute changes from previous EKG. He denies chest pain, SOB, or other component concerning for angina, ACS.  There is no obvious family history of heart disease. Grandmother feels the patient has been under extreme stress at home but he has since moved in with her. Will place cardiac monitor and repeat basic metabolic panel to r/o electrolyte abnormalities. Reviewed ED precautions for chest pain, worsening palpitations.   Hypokalemia: Serum K+ was 3.3 on 09/11/21 during hospital admission and he received oral supplementation of 40 meq. Will recheck basic metabolic panel today. Advised him to eat a diet high in potassium-rich foods.   Disposition: 7 day monitor, propranolol 10 mg QID prn, repeat bmet, follow-up with Dr. Eden Emms or APP in 1-2 months     Medication Adjustments/Labs and Tests Ordered: Current  medicines are reviewed at length with the patient today.  Concerns regarding medicines are outlined above.  Orders Placed This Encounter  Procedures   Basic metabolic panel   LONG TERM MONITOR (3-14 DAYS)   EKG 12-Lead    Meds ordered this encounter  Medications   DISCONTD: propranolol (INDERAL) 10 MG tablet    Sig: Take 1 tablet (10 mg total) by mouth as needed (Please take as needed for palpitations up to 4 times per day).    Dispense:  90 tablet    Refill:  3   propranolol (INDERAL) 10 MG tablet    Sig: Take 1 tablet (10 mg total) by mouth as needed (Please take as needed for palpitations up to 4 times per day).    Dispense:  90 tablet    Refill:  3     Patient Instructions  Medication Instructions:  Your Physician recommend you continue on your current medication as directed.    *If you need a refill on your cardiac medications before your next appointment, please call your pharmacy*   Lab Work: Your physician recommends that you return for lab work in: Today- BMP  If you have labs (blood work) drawn today and your tests are completely normal, you will receive your results only by: MyChart Message (if you have MyChart) OR A paper copy in the mail If you have any lab test that is abnormal or we need to change your treatment, we will call you to review the results.   Testing/Procedures: Your physician has recommended that you wear a Zio monitor.   This monitor is a medical device that records the heart's electrical activity. Doctors most often use these monitors to diagnose arrhythmias. Arrhythmias are problems with the speed or rhythm of the heartbeat. The monitor is a small device applied to your chest. You can wear one while you do your normal daily activities. While wearing this monitor if you have any symptoms to push the button and record what you felt. Once you have worn this monitor for the period of time provider prescribed (Usually 14 days), you will return the  monitor device in the postage paid box. Once it is returned they will download the data collected and provide [place] with a report which the provider will then review and we will call you with those results. Important tips:  Avoid showering during the first 24 hours of wearing the monitor. Avoid excessive sweating to help maximize wear time. Do not submerge the device, no hot tubs, and no swimming pools.  Keep any lotions or oils away from the patch. After 24 hours you may shower with the patch on. Take brief showers with your back facing the shower head.  Do not remove patch once it has been placed because that will interrupt data and decrease adhesive wear time. Push the button when you have any symptoms and write down what you were feeling. Once you have completed wearing your monitor, remove and place into box which has postage paid and place in your outgoing mailbox.  If for some reason you have misplaced your box then call our office and we can provide another box and/or mail it off for you.      Follow-Up: At [facility], you and your health needs are our priority.  As part of our continuing mission to provide you with exceptional heart care, we have created designated Provider Care Teams.  These Care Teams include your primary Cardiologist (physician) and Advanced Practice Providers (APPs -  Physician Assistants and Nurse Practitioners) who all work together to provide you with the care you need, when you need it.  We recommend signing up for the patient portal called "MyChart".  Sign up information is provided on this After Visit Summary.  MyChart is used to connect with patients for Virtual Visits (Telemedicine).  Patients are able to view lab/test results, encounter notes, upcoming appointments, etc.  Non-urgent messages can be sent to your provider as well.   To learn more about what you can do with MyChart, go to [email or web address].    Your next appointment:   1-2  month(s)  The format for your next appointment:   In Person  Provider:   Charlton Haws, MD  or APP     Signed, Levi Aland, NP  09/25/2021 3:17 PM    [facility]

## 2021-09-26 ENCOUNTER — Encounter (HOSPITAL_BASED_OUTPATIENT_CLINIC_OR_DEPARTMENT_OTHER): Payer: Self-pay

## 2021-09-26 LAB — BASIC METABOLIC PANEL
BUN/Creatinine Ratio: 11 (ref 9–20)
BUN: 11 mg/dL (ref 6–20)
CO2: 23 mmol/L (ref 20–29)
Calcium: 10 mg/dL (ref 8.7–10.2)
Chloride: 100 mmol/L (ref 96–106)
Creatinine, Ser: 1 mg/dL (ref 0.76–1.27)
Glucose: 94 mg/dL (ref 70–99)
Potassium: 5.2 mmol/L (ref 3.5–5.2)
Sodium: 139 mmol/L (ref 134–144)
eGFR: 111 mL/min/{1.73_m2} (ref >59)

## 2021-10-09 NOTE — Progress Notes (Signed)
Called pt. Monitor results to pt. Verbalized understanding!

## 2021-10-10 ENCOUNTER — Encounter (HOSPITAL_BASED_OUTPATIENT_CLINIC_OR_DEPARTMENT_OTHER): Payer: Self-pay

## 2021-11-27 ENCOUNTER — Ambulatory Visit (HOSPITAL_BASED_OUTPATIENT_CLINIC_OR_DEPARTMENT_OTHER): Payer: Medicaid Other | Admitting: Nurse Practitioner

## 2022-04-10 ENCOUNTER — Other Ambulatory Visit: Payer: Self-pay

## 2022-04-10 ENCOUNTER — Encounter (HOSPITAL_BASED_OUTPATIENT_CLINIC_OR_DEPARTMENT_OTHER): Payer: Self-pay | Admitting: Emergency Medicine

## 2022-04-10 ENCOUNTER — Emergency Department (HOSPITAL_BASED_OUTPATIENT_CLINIC_OR_DEPARTMENT_OTHER)
Admission: EM | Admit: 2022-04-10 | Discharge: 2022-04-10 | Disposition: A | Discharge status: Home / Self Care | Payer: Medicaid Other | Attending: Emergency Medicine | Admitting: Emergency Medicine

## 2022-04-10 DIAGNOSIS — R55 Syncope and collapse: Secondary | ICD-10-CM | POA: Diagnosis present

## 2022-04-10 LAB — CBG MONITORING, ED: Glucose-Capillary: 81 mg/dL (ref 70–99)

## 2022-04-10 LAB — URINALYSIS, ROUTINE W REFLEX MICROSCOPIC
Bilirubin Urine: NEGATIVE
Glucose, UA: NEGATIVE mg/dL
Hgb urine dipstick: NEGATIVE
Ketones, ur: NEGATIVE mg/dL
Leukocytes,Ua: NEGATIVE
Nitrite: NEGATIVE
Specific Gravity, Urine: 1.03 (ref 1.005–1.030)
pH: 6.5 (ref 5.0–8.0)

## 2022-04-10 LAB — BASIC METABOLIC PANEL
Anion gap: 9 (ref 5–15)
BUN: 12 mg/dL (ref 6–20)
CO2: 29 mmol/L (ref 22–32)
Calcium: 10.2 mg/dL (ref 8.9–10.3)
Chloride: 101 mmol/L (ref 98–111)
Creatinine, Ser: 0.95 mg/dL (ref 0.61–1.24)
GFR, Estimated: >60 mL/min (ref >60)
Glucose, Bld: 79 mg/dL (ref 70–99)
Potassium: 3.8 mmol/L (ref 3.5–5.1)
Sodium: 139 mmol/L (ref 135–145)

## 2022-04-10 LAB — CBC
HCT: 45.3 % (ref 39.0–52.0)
Hemoglobin: 14.1 g/dL (ref 13.0–17.0)
MCH: 24.6 pg — ABNORMAL LOW (ref 26.0–34.0)
MCHC: 31.1 g/dL (ref 30.0–36.0)
MCV: 79.1 fL — ABNORMAL LOW (ref 80.0–100.0)
Platelets: 272 10*3/uL (ref 150–400)
RBC: 5.73 MIL/uL (ref 4.22–5.81)
RDW: 13.6 % (ref 11.5–15.5)
WBC: 7.7 10*3/uL (ref 4.0–10.5)
nRBC: 0 % (ref 0.0–0.2)

## 2022-04-10 NOTE — ED Notes (Addendum)
Reports passing out last night hitting his back.  Denies previous hx of syncope. Abrasions noted to back, bacitracin applied to areas

## 2022-04-10 NOTE — ED Provider Notes (Signed)
[facility] EMERGENCY DEPT Provider Note   CSN: [id number] Arrival date & time: 04/10/22  1753     History  Chief Complaint  Patient presents with   Loss of Consciousness    Miguel Johnson is a 21 y.o. male.  Patient presents ER chief complaint of a syncopal episode last night around 10 or 11 PM.  He said he has been working 2 jobs and studying for school.  He had finished his job at night and came home to try to make dinner next and he knew he was waking up from the kitchen ground.  He denies any preceding headache or chest pain.  Denies any other extremity pain.  Denies any fevers or cough or vomiting or diarrhea.  His mother brought him to the ER today for evaluation.       Home Medications Prior to Admission medications   Medication Sig Start Date End Date Taking? Authorizing Provider  acetaminophen (TYLENOL) 500 MG tablet Take 1,000 mg by mouth every 6 (six) hours as needed for mild pain or headache.    [provider]  propranolol (INDERAL) 10 MG tablet Take 1 tablet (10 mg total) by mouth as needed (Please take as needed for palpitations up to 4 times per day). 09/25/21   Deberah Pelton, NP  SODIUM FLUORIDE, DENTAL RINSE, 0.2 % SOLN Use as directed 15 mLs in the mouth or throat at bedtime. 08/15/21   [provider]      Allergies    Patient has no known allergies.    Review of Systems   Review of Systems  Constitutional:  Negative for fever.  HENT:  Negative for ear pain and sore throat.   Eyes:  Negative for pain.  Respiratory:  Negative for cough.   Cardiovascular:  Negative for chest pain.  Gastrointestinal:  Negative for abdominal pain.  Genitourinary:  Negative for flank pain.  Musculoskeletal:  Negative for back pain.  Skin:  Negative for color change and rash.  Neurological:  Negative for syncope and headaches.  All other systems reviewed and are negative.   Physical Exam Updated Vital Signs BP 137/82   Pulse (!)  56   Temp 98.2 F (36.8 C)   Resp 15   Ht 6\' 1"  (1.854 m)   Wt 77.1 kg   SpO2 100%   BMI 22.43 kg/m  Physical Exam Constitutional:      Appearance: He is well-developed.  HENT:     Head: Normocephalic.     Nose: Nose normal.  Eyes:     Extraocular Movements: Extraocular movements intact.  Cardiovascular:     Rate and Rhythm: Normal rate.     Heart sounds: No murmur heard.    No friction rub. No gallop.  Pulmonary:     Effort: Pulmonary effort is normal.  Abdominal:     Tenderness: There is no abdominal tenderness. There is no guarding or rebound.  Skin:    Coloration: Skin is not jaundiced.  Neurological:     General: No focal deficit present.     Mental Status: He is alert and oriented to person, place, and time. Mental status is at baseline.     Cranial Nerves: No cranial nerve deficit.     Motor: No weakness.     Gait: Gait normal.     ED Results / Procedures / Treatments   Labs (all labs ordered are listed, but only abnormal results are displayed) Labs Reviewed  CBC - Abnormal; Notable for  the following components:      Result Value   MCV 79.1 (*)    MCH 24.6 (*)    All other components within normal limits  URINALYSIS, ROUTINE W REFLEX MICROSCOPIC - Abnormal; Notable for the following components:   Protein, ur TRACE (*)    All other components within normal limits  BASIC METABOLIC PANEL  CBG MONITORING, ED    EKG EKG Interpretation  Date/Time:  Tuesday April 10 2022 18:02:13 EDT Ventricular Rate:  56 PR Interval:  158 QRS Duration: 100 QT Interval:  386 QTC Calculation: 372 R Axis:   83 Text Interpretation: Sinus bradycardia with sinus arrhythmia Early repolarization Otherwise normal ECG When compared with ECG of 12-Sep-2021 04:50, No significant change was found Confirmed by Thamas Jaegers ([id number]) on 04/10/2022 6:05:14 PM  Radiology No results found.  Procedures Procedures    Medications Ordered in ED Medications - No data to display  ED  Course/ Medical Decision Making/ A&P                           Medical Decision Making Amount and/or Complexity of Data Reviewed Labs: ordered.   Chart review shows prior visit and admission in November 2022 for abnormal EKG cardiac work-up which was unremarkable.  History obtained from mother at bedside.  Mordecai Rasmussen studies included labs which were remarkable white count normal chemistry normal.  EKG however is abnormal with diffuse ST elevations J-point elevations noted in the inferior lateral leads consistent with prior EKG however.  Family otherwise denies any incidence of sudden death in the family, patient otherwise is active plays a full game of basketball 2 weeks ago without any symptoms of shortness of breath or lightheadedness.  I did review his echo he had done last year which showed no structural abnormalities.  Given his abnormal EKG and repeat syncopal episode, patient advised outpatient follow-up with cardiology again.  Advised avoidance of strenuous activity until cleared by cardiology.          Final Clinical Impression(s) / ED Diagnoses Final diagnoses:  Syncope, unspecified syncope type    Rx / DC Orders ED Discharge Orders     None         Luna Fuse, MD 04/10/22 [phone]

## 2022-04-10 NOTE — Discharge Instructions (Signed)
Your blood tests were normal.  Take some time to rest for the next few days.  Follow-up with your cardiologist by calling to make an appointment this week.

## 2022-04-10 NOTE — ED Triage Notes (Addendum)
Last night, reports passing out, fall from standing. Hit back, has scratches on back. Witnessed, reports he was out for 5-10 second. Never happened before.experiencing headache today. Report he didn't eat much yesterday.

## 2022-04-27 NOTE — Progress Notes (Unsigned)
Office Visit    Patient Name: Miguel Johnson Date of Encounter: 04/30/2022  Primary Care Provider:  Pritt, Jodelle Gross, MD Primary Cardiologist:  Charlton Haws, MD Primary Electrophysiologist: None  Chief Complaint    Miguel Johnson is a 21 y.o. male with PMH of palpitations, abnormal EKG, hypokalemia, and anxiety who presents today for hospital follow-up for syncope.  Past Medical History    Past Medical History:  Diagnosis Date   Abnormal EKG    Past Surgical History:  Procedure Laterality Date   KNEE ARTHROSCOPY WITH DRILLING/MICROFRACTURE Left 11/21/2018   Procedure: MICROFRACTURE;  Surgeon: Sheral Apley, MD;  Location: [facility];  Service: Orthopedics;  Laterality: Left;   KNEE ARTHROSCOPY WITH MENISCAL REPAIR Left 11/21/2018   Procedure: KNEE ARTHROSCOPY WITH MEDIAL MENISCAL REPAIR;  Surgeon: Sheral Apley, MD;  Location: [facility];  Service: Orthopedics;  Laterality: Left;   ORCHIOPEXY Right 11/26/2018   Procedure: BILATERAL ORCHIOPEXY PEDIATRIC RIGHT TESTICULAR EXPLORATION;  Surgeon: Kandice Hams, MD;  Location: [facility];  Service: Pediatrics;  Laterality: Right;    Allergies  No Known Allergies  History of Present Illness    Miguel Johnson is a 21 year old with the above-mentioned past medical history who presents today for hospital follow-up after experiencing syncope on 04/10/2022.  Miguel Johnson was seen previously  in 08/2021 for constipation and was found to have abnormal EKG showing ST segment elevation and LVH.  Due to elevated ST segment he was transferred to [facility] for observation.  Patient reported episodic palpitations and anxiety that have been occurring over the past month.  EKG and troponins were unremarkable.  2D echo was performed with normal EF and no structural heart abnormalities.  He was discharged later that day.    He was seen in follow-up by Eligha Bridegroom, NP for follow-up and reported continued  palpitations.  He was started on propranolol 10 mg and 7-day ZIO was ordered.  Monitor showed predominantly NSR with rare PACs/PVCs occurring less than 1% of the time.   He presented to [facility] on 04/10/2022 after experiencing syncopal episode lasting 5 to 10 seconds.  Patient stated that he was making dinner after coming home from work and was in his kitchen when he woke up on the floor.  EKG was abnormal consistent with prior EKG with ST elevations and J-point elevations.  His lab work was all within normal limits.  He presents today for follow-up of syncope.  He presents today alone for his follow-up and states that he is not had any similar syncopal events since 04/10/2022.  He is compliant with his propranolol but states that prior to his syncope he had not taken any in 5 months.  He denies any new supplements, illicit drugs, or alcohol prior to his event.  Patient denies chest pain, palpitations, dyspnea, PND, orthopnea, nausea, vomiting, dizziness, syncope, edema, weight gain, or early satiety.'s  Home Medications    Current Outpatient Medications  Medication Sig Dispense Refill   acetaminophen (TYLENOL) 500 MG tablet Take 1,000 mg by mouth every 6 (six) hours as needed for mild pain or headache. (Patient not taking: Reported on 04/30/2022)     propranolol (INDERAL) 10 MG tablet Take 1 tablet (10 mg total) by mouth as needed (Please take as needed for palpitations up to 4 times per day). (Patient not taking: Reported on 04/30/2022) 90 tablet 3   SODIUM FLUORIDE, DENTAL RINSE, 0.2 % SOLN Use as directed 15  mLs in the mouth or throat at bedtime. (Patient not taking: Reported on 04/30/2022)     No current facility-administered medications for this visit.     Review of Systems  Please see the history of present illness.      All other systems reviewed and are otherwise negative except as noted above.  Physical Exam    Wt Readings from Last 3 Encounters:  04/30/22 169 lb 3.2 oz  (76.7 kg) (70 %, Z= 0.54)*  04/10/22 170 lb (77.1 kg) (72 %, Z= 0.57)*  09/25/21 169 lb 8 oz (76.9 kg) (73 %, Z= 0.62)*   * Growth percentiles are based on CDC (Boys, 2-20 Years) data.   VS: Vitals:   04/30/22 1449  BP: 126/70  Pulse: 64  SpO2: 99%  ,Body mass index is 21.72 kg/m.  Constitutional:      Appearance: Healthy appearance. Not in distress.  Neck:     Vascular: JVD normal.  Pulmonary:     Effort: Pulmonary effort is normal.     Breath sounds: No wheezing. No rales. Diminished in the bases Cardiovascular:     Normal rate. Regular rhythm. Normal S1. Normal S2.      Murmurs: There is no murmur.  Edema:    Peripheral edema absent.  Abdominal:     Palpations: Abdomen is soft non tender. There is no hepatomegaly.  Skin:    General: Skin is warm and dry.  Neurological:     General: No focal deficit present.     Mental Status: Alert and oriented to person, place and time.     Cranial Nerves: Cranial nerves are intact.  EKG/LABS/Other Studies Reviewed    ECG personally reviewed by me today -sinus rhythm with rate of 64 with similar pattern noted on previous EKGs.  No acute findings  Lab Results  Component Value Date   WBC 7.7 04/10/2022   HGB 14.1 04/10/2022   HCT 45.3 04/10/2022   MCV 79.1 (L) 04/10/2022   PLT 272 04/10/2022   Lab Results  Component Value Date   CREATININE 0.95 04/10/2022   BUN 12 04/10/2022   NA 139 04/10/2022   K 3.8 04/10/2022   CL 101 04/10/2022   CO2 29 04/10/2022   No results found for: "ALT", "AST", "GGT", "ALKPHOS", "BILITOT" No results found for: "CHOL", "HDL", "LDLCALC", "LDLDIRECT", "TRIG", "CHOLHDL"  No results found for: "HGBA1C"  Assessment & Plan    1.  Syncope: -Patient reported witnessed syncopal event at home while cooking dinner.  Since then he denies any subsequent events. -Patient's BMET and CBC were all within normal limits and urinalysis was also normal -30-day event monitor to evaluate syncope and possible  arrhythmia -Per [place] DMV guidelines, patient advised no driving for 6 months, will need to be cleared by our office prior to returning.    2.  Palpitations: -2D echo was completed on 08/2021 with EF of 55-60%, no LVH, normal valve function -Patient report today that he has noticed last palpitations since resuming his propranolol -I reached out to Dr. Eden Emms regarding need for further testing such as MRI and he feels that it is not warranted at this time due to previous normal test. -Continue as needed propranolol 10 mg -Patient will wear 30-day event monitor as noted above  Disposition: Follow-up with Charlton Haws, MD or APP in 2 months   Medication Adjustments/Labs and Tests Ordered: Current medicines are reviewed at length with the patient today.  Concerns regarding medicines are outlined above.  Signed, Napoleon Form, Leodis Rains, NP 04/30/2022, 4:01 PM [facility]

## 2022-04-30 ENCOUNTER — Encounter: Payer: Self-pay | Admitting: *Deleted

## 2022-04-30 ENCOUNTER — Ambulatory Visit (INDEPENDENT_AMBULATORY_CARE_PROVIDER_SITE_OTHER): Payer: Medicaid Other

## 2022-04-30 ENCOUNTER — Encounter: Payer: Self-pay | Admitting: Nurse Practitioner

## 2022-04-30 ENCOUNTER — Ambulatory Visit (INDEPENDENT_AMBULATORY_CARE_PROVIDER_SITE_OTHER): Payer: Medicaid Other | Admitting: Nurse Practitioner

## 2022-04-30 VITALS — BP 126/70 | HR 64 | Ht 74.0 in | Wt 169.2 lb

## 2022-04-30 DIAGNOSIS — R55 Syncope and collapse: Secondary | ICD-10-CM

## 2022-04-30 DIAGNOSIS — R9431 Abnormal electrocardiogram [ECG] [EKG]: Secondary | ICD-10-CM

## 2022-04-30 DIAGNOSIS — R002 Palpitations: Secondary | ICD-10-CM

## 2022-04-30 NOTE — Progress Notes (Unsigned)
[employer] [id number] from office inventory applied to patient.   Dr. Eden Emms to read.

## 2022-04-30 NOTE — Patient Instructions (Signed)
Medication Instructions:   Your physician recommends that you continue on your current medications as directed. Please refer to the Current Medication list given to you today.   *If you need a refill on your cardiac medications before your next appointment, please call your pharmacy*   Lab Work:  None ordered.  If you have labs (blood work) drawn today and your tests are completely normal, you will receive your results only by: MyChart Message (if you have MyChart) OR A paper copy in the mail If you have any lab test that is abnormal or we need to change your treatment, we will call you to review the results.   Testing/Procedures:  [employer] Cardiac Event Monitor Instructions Your physician has requested you wear your cardiac event monitor for ___30__ days, (1-30). [employer] may call or text to confirm a shipping address. The monitor will be sent to a land address via UPS. [employer] will not ship a monitor to a PO BOX. It typically takes 3-5 days to receive your monitor after it has been enrolled. [employer] will assist with USPS tracking if your package is delayed. The telephone number for [employer] is [phone]. Once you have received your monitor, please review the enclosed instructions. Instruction tutorials can also be viewed under help and settings on the enclosed cell phone. Your monitor has already been registered assigning a specific monitor serial # to you.  Applying the monitor Remove cell phone from case and turn it on. The cell phone works as IT consultant and needs to be within [employer] of you at all times. The cell phone will need to be charged on a daily basis. We recommend you plug the cell phone into the enclosed charger at your bedside table every night.  Monitor batteries: You will receive two monitor batteries labelled #1 and #2. These are your recorders. Plug battery #2 onto the second connection on the enclosed charger. Keep one battery on the  charger at all times. This will keep the monitor battery deactivated. It will also keep it fully charged for when you need to switch your monitor batteries. A small light will be blinking on the battery emblem when it is charging. The light on the battery emblem will remain on when the battery is fully charged.  Open package of a Monitor strip. Insert battery #1 into black hood on strip and gently squeeze monitor battery onto connection as indicated in instruction booklet. Set aside while preparing skin.  Choose location for your strip, vertical or horizontal, as indicated in the instruction booklet. Shave to remove all hair from location. There cannot be any lotions, oils, powders, or colognes on skin where monitor is to be applied. Wipe skin clean with enclosed Saline wipe. Dry skin completely.  Peel paper labeled #1 off the back of the Monitor strip exposing the adhesive. Place the monitor on the chest in the vertical or horizontal position shown in the instruction booklet. One arrow on the monitor strip must be pointing upward. Carefully remove paper labeled #2, attaching remainder of strip to your skin. Try not to create any folds or wrinkles in the strip as you apply it.  Firmly press and release the circle in the center of the monitor battery. You will hear a small beep. This is turning the monitor battery on. The heart emblem on the monitor battery will light up every 5 seconds if the monitor battery in turned on and connected to the patient securely. Do not push and hold the circle down  as this turns the monitor battery off. The cell phone will locate the monitor battery. A screen will appear on the cell phone checking the connection of your monitor strip. This may read poor connection initially but change to good connection within the next minute. Once your monitor accepts the connection you will hear a series of 3 beeps followed by a climbing crescendo of beeps. A screen will appear  on the cell phone showing the two monitor strip placement options. Touch the picture that demonstrates where you applied the monitor strip.  Your monitor strip and battery are waterproof. You are able to shower, bathe, or swim with the monitor on. They just ask you do not submerge deeper than 3 feet underwater. We recommend removing the monitor if you are swimming in a lake, river, or ocean.  Your monitor battery will need to be switched to a fully charged monitor battery approximately once a week. The cell phone will alert you of an action which needs to be made.  On the cell phone, tap for details to reveal connection status, monitor battery status, and cell phone battery status. The green dots indicates your monitor is in good status. A red dot indicates there is something that needs your attention.  To record a symptom, click the circle on the monitor battery. In 30-60 seconds a list of symptoms will appear on the cell phone. Select your symptom and tap save. Your monitor will record a sustained or significant arrhythmia regardless of you clicking the button. Some patients do not feel the heart rhythm irregularities. [employer] will notify us of any serious or critical events.  Refer to instruction booklet for instructions on switching batteries, changing strips, the Do not disturb or Pause features, or any additional questions.  Call [employer] at [phone], to confirm your monitor is transmitting and record your baseline. They will answer any questions you may have regarding the monitor instructions at that time.  Returning the monitor to [employer] Place all equipment back into blue box. Peel off strip of paper to expose adhesive and close box securely. There is a prepaid UPS shipping label on this box. Drop in a UPS drop box, or at a UPS facility like [employer]. You may also contact [employer] to arrange UPS to pick up monitor package at your home.    Follow-Up: At [facility], you and your health needs are our priority.  As part of our continuing mission to provide you with exceptional heart care, we have created designated Provider Care Teams.  These Care Teams include your primary Cardiologist (physician) and Advanced Practice Providers (APPs -  Physician Assistants and Nurse Practitioners) who all work together to provide you with the care you need, when you need it.  We recommend signing up for the patient portal called "MyChart".  Sign up information is provided on this After Visit Summary.  MyChart is used to connect with patients for Virtual Visits (Telemedicine).  Patients are able to view lab/test results, encounter notes, upcoming appointments, etc.  Non-urgent messages can be sent to your provider as well.   To learn more about what you can do with MyChart, go to [email or web address].    Your next appointment:   2 month(s)  The format for your next appointment:   In Person  Provider:   Robin Searing, NP         Important Information About Sugar

## 2022-05-11 ENCOUNTER — Telehealth: Payer: Self-pay

## 2022-05-11 ENCOUNTER — Telehealth: Payer: Self-pay | Admitting: Cardiovascular Disease

## 2022-05-11 NOTE — Telephone Encounter (Signed)
Mother returned call from voice mail.

## 2022-05-11 NOTE — Telephone Encounter (Signed)
Received event notifications from [employer] of 2 episodes of serious events on 05/09/22. Spoke with patient who states he did not experience any symptoms at time of events.   Reviewed with Dr Clifton James, DOD and no new orders received.

## 2022-05-11 NOTE — Telephone Encounter (Signed)
Received event notification from [employer] of a serious event of SVT on 05/11/22 at 15:30. This is similar to the event reported on 05/09/22 that was reviewed earlier today by Dr. Clifton James.   Spoke with patient who states he feel ok and was playing basketball at time of event. Denies CP, SOB and palpitations. DOD has left office for the day. Will follow up with provider on Monday.

## 2022-05-28 ENCOUNTER — Telehealth: Payer: Self-pay

## 2022-05-28 DIAGNOSIS — I471 Supraventricular tachycardia: Secondary | ICD-10-CM

## 2022-05-28 NOTE — Telephone Encounter (Signed)
   Cardiac Monitor Alert  Date of alert:  05/28/2022   Patient Name: Miguel Johnson  DOB: 12-Dec-2000  MRN: [id number]   [facility] Cardiologist: Charlton Haws, MD  [facility] EP:  Robin Searing, NP    Monitor Information: Cardiac Event Monitor [[employer]]  Reason:  Witnessed syncopal episode Ordering provider:  Robin Searing, NP; 30 day monitor CEM   Alert Supraventricular Tachycardia - fastest HR:  189 This is the  unknown  alert for this rhythm.  Two events reported SVT with artifact 05/25/2022 at 1109 pm and 1110 pm.    Next Cardiology Appointment   Date:  07/10/2022  Provider:  Robin Searing, NP  The patient could NOT be reached by telephone today.  Spoke to patient mother, patient at work at time of call.  Monitor is now irritating skin, rash has developed.   Arrhythmia, symptoms and history reviewed with Dr. Mendel Ryder, DOD 05/28/2022.   Plan:  Pt may remove monitor due to rash / skin irritation.  EOS is 05/29/2022.  MSG sent to Dr. Eden Emms, RN, and Robin Searing, NP regarding SVT & artifact captures.  Communicate findings to MD and APP.    Other: Will try to contact patient again today, after work shift ends.  Pt mother stated he has been asymptomatic, and has had no complaints 7/28-7/31 other than skin irritation from the monitor.    Elmore Guise, RN  05/28/2022 11:46 AM

## 2022-06-05 ENCOUNTER — Telehealth: Payer: Self-pay

## 2022-06-05 NOTE — Telephone Encounter (Signed)
   Cardiac Monitor Alert  Date of alert:  06/05/2022   Patient Name: Miguel Johnson  DOB: Mar 05, 2001  MRN: [id number]   [facility] Cardiologist: Charlton Haws, MD  [facility] EP:  None    Monitor Information: Cardiac Event Monitor [[employer]]  Reason:  Syncope Ordering provider:  Charlton Haws, MD   Alert Supraventricular Tachycardia - fastest HR:    This is the EOS report with 6 episodes of SVT and 2 reports of tachycardia.  Next Cardiology Appointment   Date:  07/10/22  Provider:  Robin Searing, NP  The patient was contacted today.  He is asymptomatic. Arrhythmia, symptoms and history reviewed with Dr. Tenny Craw (DOD).   Plan:  Follow up as schedule on 07/10/22  Other: Need to find out if duration for SVT is accurate in monitor reports.   Eilleen Kempf, RN  06/05/2022 9:43 AM

## 2022-06-11 MED ORDER — METOPROLOL SUCCINATE ER 25 MG PO TB24: 25.0000 mg | ORAL_TABLET | Freq: Every day | ORAL | 11 refills | Status: DC

## 2022-06-11 NOTE — Telephone Encounter (Signed)
Per Dr. Eden Emms, Would start Toprol 25 mg daily and have him see EP for SVT.  Called patient to let him know of Dr. Fabio Bering advisement. Patient verbalized agreement.

## 2022-06-13 ENCOUNTER — Telehealth: Payer: Self-pay | Admitting: Cardiovascular Disease

## 2022-06-13 NOTE — Telephone Encounter (Signed)
Pt aware of the need to keep EP appt re abnormal monitor ./cy

## 2022-06-13 NOTE — Telephone Encounter (Signed)
Pt returning nurses call regarding Heart Monitor results. Please advise

## 2022-07-06 NOTE — Progress Notes (Unsigned)
Office Visit    Patient Name: Miguel Johnson Date of Encounter: 07/06/2022  Primary Care Provider:  Pritt, Jodelle Gross, MD Primary Cardiologist:  Charlton Haws, MD Primary Electrophysiologist: None  Chief Complaint   Miguel Johnson is a 21 y.o. male with PMH of palpitations, abnormal EKG, hypokalemia, and anxiety who presents today for hospital follow-up for syncope.  Past Medical History    Past Medical History:  Diagnosis Date   Abnormal EKG    Past Surgical History:  Procedure Laterality Date   KNEE ARTHROSCOPY WITH DRILLING/MICROFRACTURE Left 11/21/2018   Procedure: MICROFRACTURE;  Surgeon: Sheral Apley, MD;  Location: [facility];  Service: Orthopedics;  Laterality: Left;   KNEE ARTHROSCOPY WITH MENISCAL REPAIR Left 11/21/2018   Procedure: KNEE ARTHROSCOPY WITH MEDIAL MENISCAL REPAIR;  Surgeon: Sheral Apley, MD;  Location: [facility];  Service: Orthopedics;  Laterality: Left;   ORCHIOPEXY Right 11/26/2018   Procedure: BILATERAL ORCHIOPEXY PEDIATRIC RIGHT TESTICULAR EXPLORATION;  Surgeon: Kandice Hams, MD;  Location: [facility];  Service: Pediatrics;  Laterality: Right;    Allergies  No Known Allergies  History of Present Illness    Miguel Johnson is a 21 year old with the above-mentioned past medical history who presents today for hospital follow-up after experiencing syncope on 04/10/2022.  Miguel Johnson was seen previously  in 08/2021 for constipation and was found to have abnormal EKG showing ST segment elevation and LVH.  Due to elevated ST segment he was transferred to [facility] for observation.  Patient reported episodic palpitations and anxiety that have been occurring over the past month.  EKG and troponins were unremarkable.  2D echo was performed with normal EF and no structural heart abnormalities.  He was discharged later that day.     He was seen in follow-up by Eligha Bridegroom, NP for follow-up and reported continued  palpitations.  He was started on propranolol 10 mg and 7-day ZIO was ordered.  Monitor showed predominantly NSR with rare PACs/PVCs occurring less than 1% of the time.  [facility] after experiencing syncopal episode lasting 5 to 10 seconds.  He was seen for posthospital follow-up 04/2022 following syncopal episode.  During visit patient was asymptomatic.  He was placed on 30-day event monitor.  Event monitor showed multiple prolonged episodes of SVT and he was referred to electrophysiology.   Since last being seen in the office patient reports***.  Patient denies chest pain, palpitations, dyspnea, PND, orthopnea, nausea, vomiting, dizziness, syncope, edema, weight gain, or early satiety.     ***Notes:  Home Medications    Current Outpatient Medications  Medication Sig Dispense Refill   acetaminophen (TYLENOL) 500 MG tablet Take 1,000 mg by mouth every 6 (six) hours as needed for mild pain or headache. (Patient not taking: Reported on 04/30/2022)     metoprolol succinate (TOPROL XL) 25 MG 24 hr tablet Take 1 tablet (25 mg total) by mouth daily. 30 tablet 11   propranolol (INDERAL) 10 MG tablet Take 1 tablet (10 mg total) by mouth as needed (Please take as needed for palpitations up to 4 times per day). (Patient not taking: Reported on 04/30/2022) 90 tablet 3   SODIUM FLUORIDE, DENTAL RINSE, 0.2 % SOLN Use as directed 15 mLs in the mouth or throat at bedtime. (Patient not taking: Reported on 04/30/2022)     No current facility-administered medications for this visit.     Review of Systems  Please see  the history of present illness.    (+)*** (+)***  All other systems reviewed and are otherwise negative except as noted above.  Physical Exam    Wt Readings from Last 3 Encounters:  04/30/22 169 lb 3.2 oz (76.7 kg) (70 %, Z= 0.54)*  04/10/22 170 lb (77.1 kg) (72 %, Z= 0.57)*  09/25/21 169 lb 8 oz (76.9 kg) (73 %, Z= 0.62)*   * Growth percentiles are based  on CDC (Boys, 2-20 Years) data.   WU:XLKGM were no vitals filed for this visit.,There is no height or weight on file to calculate BMI.  Constitutional:      Appearance: Healthy appearance. Not in distress.  Neck:     Vascular: JVD normal.  Pulmonary:     Effort: Pulmonary effort is normal.     Breath sounds: No wheezing. No rales. Diminished in the bases Cardiovascular:     Normal rate. Regular rhythm. Normal S1. Normal S2.      Murmurs: There is no murmur.  Edema:    Peripheral edema absent.  Abdominal:     Palpations: Abdomen is soft non tender. There is no hepatomegaly.  Skin:    General: Skin is warm and dry.  Neurological:     General: No focal deficit present.     Mental Status: Alert and oriented to person, place and time.     Cranial Nerves: Cranial nerves are intact.  EKG/LABS/Other Studies Reviewed    ECG personally reviewed by me today - ***  Risk Assessment/Calculations:   {Does this patient have ATRIAL FIBRILLATION?[phone]}        Lab Results  Component Value Date   WBC 7.7 04/10/2022   HGB 14.1 04/10/2022   HCT 45.3 04/10/2022   MCV 79.1 (L) 04/10/2022   PLT 272 04/10/2022   Lab Results  Component Value Date   CREATININE 0.95 04/10/2022   BUN 12 04/10/2022   NA 139 04/10/2022   K 3.8 04/10/2022   CL 101 04/10/2022   CO2 29 04/10/2022   No results found for: "ALT", "AST", "GGT", "ALKPHOS", "BILITOT" No results found for: "CHOL", "HDL", "LDLCALC", "LDLDIRECT", "TRIG", "CHOLHDL"  No results found for: "HGBA1C"  Assessment & Plan    1.  Palpitations  2.  Syncope  3.***  4.***      Disposition: Follow-up with Charlton Haws, MD or APP in *** months {Are you ordering a CV Procedure (e.g. stress test, cath, DCCV, TEE, etc)?   Press F2        :[id number]}   Medication Adjustments/Labs and Tests Ordered: Current medicines are reviewed at length with the patient today.  Concerns regarding medicines are outlined above.   Signed, Napoleon Form,  Leodis Rains, NP 07/06/2022, 1:03 PM [facility]

## 2022-07-10 ENCOUNTER — Encounter: Payer: Self-pay | Admitting: Nurse Practitioner

## 2022-07-10 ENCOUNTER — Ambulatory Visit: Payer: Medicaid Other | Attending: Nurse Practitioner | Admitting: Nurse Practitioner

## 2022-07-10 VITALS — BP 90/60 | HR 77 | Ht 74.0 in | Wt 168.0 lb

## 2022-07-10 DIAGNOSIS — R55 Syncope and collapse: Secondary | ICD-10-CM

## 2022-07-10 DIAGNOSIS — I471 Supraventricular tachycardia: Secondary | ICD-10-CM | POA: Diagnosis not present

## 2022-07-10 NOTE — Patient Instructions (Addendum)
Medication Instructions:  Your physician has recommended you make the following change in your medication:  STOP PROPRANOLOL  TAKE TOPROL XL 25 MG DAILY BUT HOLD IF BLOOD PRESSURE IS LESS THAN 100 OR HEART RATE IS LESS THAN 60. CALL OUR OFFICE IF IT CONTINUES TO BE LOW.   *If you need a refill on your cardiac medications before your next appointment, please call your pharmacy*   Lab Work: NONE If you have labs (blood work) drawn today and your tests are completely normal, you will receive your results only by: MyChart Message (if you have MyChart) OR A paper copy in the mail If you have any lab test that is abnormal or we need to change your treatment, we will call you to review the results.   Testing/Procedures: NONE   Follow-Up: At [facility], you and your health needs are our priority.  As part of our continuing mission to provide you with exceptional heart care, we have created designated Provider Care Teams.  These Care Teams include your primary Cardiologist (physician) and Advanced Practice Providers (APPs -  Physician Assistants and Nurse Practitioners) who all work together to provide you with the care you need, when you need it.  We recommend signing up for the patient portal called "MyChart".  Sign up information is provided on this After Visit Summary.  MyChart is used to connect with patients for Virtual Visits (Telemedicine).  Patients are able to view lab/test results, encounter notes, upcoming appointments, etc.  Non-urgent messages can be sent to your provider as well.   To learn more about what you can do with MyChart, go to [email or web address].    Your next appointment:   3 month(s)  The format for your next appointment:   In Person  Provider:   Charlton Haws, MD  OR APP   Important Information About Sugar

## 2022-08-03 ENCOUNTER — Encounter: Payer: Self-pay | Admitting: Internal Medicine

## 2022-08-03 ENCOUNTER — Ambulatory Visit: Payer: Medicaid Other | Attending: Internal Medicine | Admitting: Internal Medicine

## 2022-08-03 DIAGNOSIS — I471 Supraventricular tachycardia, unspecified: Secondary | ICD-10-CM | POA: Diagnosis not present

## 2022-08-03 NOTE — Patient Instructions (Addendum)
Medication Instructions:  Your physician recommends that you continue on your current medications as directed. Please refer to the Current Medication list given to you today.  *If you need a refill on your cardiac medications before your next appointment, please call your pharmacy*  Lab Work: None ordered.  If you have labs (blood work) drawn today and your tests are completely normal, you will receive your results only by: MyChart Message (if you have MyChart) OR A paper copy in the mail If you have any lab test that is abnormal or we need to change your treatment, we will call you to review the results.  Testing/Procedures: None ordered.  Follow-Up: DATES FOR SVT CATHETER ABLATION WITH DR Lewayne Bunting:  October- 16  November- 1, 3, 6, 13, 15  Please [facility] message [place] or call [phone] with a date so we can schedule your procedure.    Cardiac Ablation Cardiac ablation is a procedure to destroy, or ablate, a small amount of heart tissue in very specific places. The heart has many electrical connections. Sometimes these connections are abnormal and can cause the heart to beat very fast or irregularly. Ablating some of the areas that cause problems can improve the heart's rhythm or return it to normal. Ablation may be done for people who: Have Wolff-Parkinson-White syndrome. Have fast heart rhythms (tachycardia). Have taken medicines for an abnormal heart rhythm (arrhythmia) that were not effective or caused side effects. Have a high-risk heartbeat that may be life-threatening. During the procedure, a small incision is made in the neck or the groin, and a long, thin tube (catheter) is inserted into the incision and moved to the heart. Small devices (electrodes) on the tip of the catheter will send out electrical currents. A type of X-ray (fluoroscopy) will be used to help guide the catheter and to provide images of the heart. Tell a health care provider about: Any allergies you  have. All medicines you are taking, including vitamins, herbs, eye drops, creams, and over-the-counter medicines. Any problems you or family members have had with anesthetic medicines. Any blood disorders you have. Any surgeries you have had. Any medical conditions you have, such as kidney failure. Whether you are pregnant or may be pregnant. What are the risks? Generally, this is a safe procedure. However, problems may occur, including: Infection. Bruising and bleeding at the catheter insertion site. Bleeding into the chest, especially into the sac that surrounds the heart. This is a serious complication. Stroke or blood clots. Damage to nearby structures or organs. Allergic reaction to medicines or dyes. Need for a permanent pacemaker if the normal electrical system is damaged. A pacemaker is a small computer that sends electrical signals to the heart and helps your heart beat normally. The procedure not being fully effective. This may not be recognized until months later. Repeat ablation procedures are sometimes done. What happens before the procedure? Medicines Ask your health care provider about: Changing or stopping your regular medicines. This is especially important if you are taking diabetes medicines or blood thinners. Taking medicines such as aspirin and ibuprofen. These medicines can thin your blood. Do not take these medicines unless your health care provider tells you to take them. Taking over-the-counter medicines, vitamins, herbs, and supplements. General instructions Follow instructions from your health care provider about eating or drinking restrictions. Plan to have someone take you home from the hospital or clinic. If you will be going home right after the procedure, plan to have someone with you for 24 hours.  Ask your health care provider what steps will be taken to prevent infection. What happens during the procedure?  An IV will be inserted into one of your  veins. You will be given a medicine to help you relax (sedative). The skin on your neck or groin will be numbed. An incision will be made in your neck or your groin. A needle will be inserted through the incision and into a large vein in your neck or groin. A catheter will be inserted into the needle and moved to your heart. Dye may be injected through the catheter to help your surgeon see the area of the heart that needs treatment. Electrical currents will be sent from the catheter to ablate heart tissue in desired areas. There are three types of energy that may be used to do this: Heat (radiofrequency energy). Laser energy. Extreme cold (cryoablation). When the tissue has been ablated, the catheter will be removed. Pressure will be held on the insertion area to prevent a lot of bleeding. A bandage (dressing) will be placed over the insertion area. The exact procedure may vary among health care providers and hospitals. What happens after the procedure? Your blood pressure, heart rate, breathing rate, and blood oxygen level will be monitored until you leave the hospital or clinic. Your insertion area will be monitored for bleeding. You will need to lie still for a few hours to ensure that you do not bleed from the insertion area. Do not drive for 24 hours or as long as told by your health care provider. Summary Cardiac ablation is a procedure to destroy, or ablate, a small amount of heart tissue using an electrical current. This procedure can improve the heart rhythm or return it to normal. Tell your health care provider about any medical conditions you may have and all medicines you are taking to treat them. This is a safe procedure, but problems may occur. Problems may include infection, bruising, damage to nearby organs or structures, or allergic reactions to medicines. Follow your health care provider's instructions about eating and drinking before the procedure. You may also be told to  change or stop some of your medicines. After the procedure, do not drive for 24 hours or as long as told by your health care provider. This information is not intended to replace advice given to you by your health care provider. Make sure you discuss any questions you have with your health care provider. Document Revised: 01/05/2022 Document Reviewed: 08/24/2019 Elsevier Patient Education  [place].

## 2022-08-03 NOTE — Progress Notes (Signed)
HPI Miguel Johnson returns today for followup. He is a pleasant 21 yo man with palpitations. He has worn a cardiac monitor which demonstrates what appears to be SVT although there is some artifact. He notes that his episodes start suddenly and will stop either suddenly or gradually. He has never had syncope. He does not have chest pain or sob.  No Known Allergies   Current Outpatient Medications  Medication Sig Dispense Refill   acetaminophen (TYLENOL) 500 MG tablet Take 1,000 mg by mouth every 6 (six) hours as needed for mild pain or headache.     metoprolol succinate (TOPROL XL) 25 MG 24 hr tablet Take 1 tablet (25 mg total) by mouth daily. 30 tablet 11   No current facility-administered medications for this visit.     Past Medical History:  Diagnosis Date   Abnormal EKG     ROS:   All systems reviewed and negative except as noted in the HPI.   Past Surgical History:  Procedure Laterality Date   KNEE ARTHROSCOPY WITH DRILLING/MICROFRACTURE Left 11/21/2018   Procedure: MICROFRACTURE;  Surgeon: Sheral Apley, MD;  Location: [facility];  Service: Orthopedics;  Laterality: Left;   KNEE ARTHROSCOPY WITH MENISCAL REPAIR Left 11/21/2018   Procedure: KNEE ARTHROSCOPY WITH MEDIAL MENISCAL REPAIR;  Surgeon: Sheral Apley, MD;  Location: [facility];  Service: Orthopedics;  Laterality: Left;   ORCHIOPEXY Right 11/26/2018   Procedure: BILATERAL ORCHIOPEXY PEDIATRIC RIGHT TESTICULAR EXPLORATION;  Surgeon: Kandice Hams, MD;  Location: [facility];  Service: Pediatrics;  Laterality: Right;     Family History  Problem Relation Age of Onset   Heart murmur Maternal Grandfather    Heart attack Other      Social History   Socioeconomic History   Marital status: Single    Spouse name: Not on file   Number of children: Not on file   Years of education: Not on file   Highest education level: Not on file  Occupational History   Not on file  Tobacco  Use   Smoking status: Never    Passive exposure: Never   Smokeless tobacco: Never  Vaping Use   Vaping Use: Never used  Substance and Sexual Activity   Alcohol use: Never   Drug use: Not Currently   Sexual activity: Yes  Other Topics Concern   Not on file  Social History Narrative   Not on file   Social Determinants of Health   Financial Resource Strain: Not on file  Food Insecurity: Not on file  Transportation Needs: Not on file  Physical Activity: Not on file  Stress: Not on file  Social Connections: Not on file  Intimate Partner Violence: Not on file     BP 136/64   Pulse 60   Ht 6\' 2"  (1.88 m)   Wt 178 lb (80.7 kg)   SpO2 97%   BMI 22.85 kg/m   Physical Exam:  Well appearing NAD HEENT: Unremarkable Neck:  No JVD, no thyromegally Lymphatics:  No adenopathy Back:  No CVA tenderness Lungs:  Clear with no wheezes HEART:  Regular rate rhythm, no murmurs, no rubs, no clicks Abd:  soft, positive bowel sounds, no organomegally, no rebound, no guarding Ext:  2 plus pulses, no edema, no cyanosis, no clubbing Skin:  No rashes no nodules Neuro:  CN II through XII intact, motor grossly intact  EKG - nsr with no preexcitation. J point elevation  Assess/Plan:  Palpitations, probable  SVT - I have discussed the treatment options with the patient and his parents. It looks like he has SVT but cannot definitively exclude sinus tachycardia. He has been prescribed low dose toprol. He can continue this or undergo EP study and ablation. The pro's and con's, risks/benefits/goals/expectations were reviewed with the patient and he will call us if he wishes to proceed.   Miguel Overlie Chanel Mckesson,MD

## 2022-08-07 ENCOUNTER — Telehealth: Payer: Self-pay | Admitting: Internal Medicine

## 2022-08-07 DIAGNOSIS — I471 Supraventricular tachycardia, unspecified: Secondary | ICD-10-CM

## 2022-08-07 NOTE — Telephone Encounter (Signed)
Patient is called to schedule his ablation.

## 2022-08-08 NOTE — Telephone Encounter (Signed)
Pt called / SVT Ablation with Dr. Lovena Le scheduled for 09/10/2022 at 730 am.    Pt having blood drawn 08/20/22, CBC and BMET.    Procedure scheduled / letter sent / Dr. Lovena Le orders entered.

## 2022-08-20 ENCOUNTER — Ambulatory Visit: Payer: Medicaid Other | Attending: Internal Medicine

## 2022-08-20 DIAGNOSIS — I471 Supraventricular tachycardia, unspecified: Secondary | ICD-10-CM

## 2022-08-21 LAB — BASIC METABOLIC PANEL
BUN/Creatinine Ratio: 9 (ref 9–20)
BUN: 8 mg/dL (ref 6–20)
CO2: 25 mmol/L (ref 20–29)
Calcium: 9.9 mg/dL (ref 8.7–10.2)
Chloride: 101 mmol/L (ref 96–106)
Creatinine, Ser: 0.94 mg/dL (ref 0.76–1.27)
Glucose: 85 mg/dL (ref 70–99)
Potassium: 4.2 mmol/L (ref 3.5–5.2)
Sodium: 141 mmol/L (ref 134–144)
eGFR: 120 mL/min/{1.73_m2} (ref >59)

## 2022-08-21 LAB — CBC WITH DIFFERENTIAL/PLATELET
Basophils Absolute: 0 10*3/uL (ref 0.0–0.2)
Basos: 1 %
EOS (ABSOLUTE): 0 10*3/uL (ref 0.0–0.4)
Eos: 1 %
Hematocrit: 42.7 % (ref 37.5–51.0)
Hemoglobin: 14.3 g/dL (ref 13.0–17.7)
Immature Grans (Abs): 0 10*3/uL (ref 0.0–0.1)
Immature Granulocytes: 0 %
Lymphocytes Absolute: 3.8 10*3/uL — ABNORMAL HIGH (ref 0.7–3.1)
Lymphs: 57 %
MCH: 25.9 pg — ABNORMAL LOW (ref 26.6–33.0)
MCHC: 33.5 g/dL (ref 31.5–35.7)
MCV: 77 fL — ABNORMAL LOW (ref 79–97)
Monocytes Absolute: 0.5 10*3/uL (ref 0.1–0.9)
Monocytes: 7 %
Neutrophils Absolute: 2.3 10*3/uL (ref 1.4–7.0)
Neutrophils: 34 %
Platelets: 248 10*3/uL (ref 150–450)
RBC: 5.52 x10E6/uL (ref 4.14–5.80)
RDW: 13.1 % (ref 11.6–15.4)
WBC: 6.6 10*3/uL (ref 3.4–10.8)

## 2022-09-07 NOTE — Pre-Procedure Instructions (Signed)
Instructed patient on the following items: °Arrival time 0530 °Nothing to eat or drink after midnight °No meds AM of procedure °Responsible person to drive you home and stay with you for 24 hrs ° ° °   °

## 2022-09-10 ENCOUNTER — Other Ambulatory Visit: Payer: Self-pay

## 2022-09-10 ENCOUNTER — Ambulatory Visit (HOSPITAL_COMMUNITY)
Admission: RE | Admit: 2022-09-10 | Discharge: 2022-09-10 | Disposition: A | Discharge status: Home / Self Care | Payer: Medicaid Other | Attending: Internal Medicine | Admitting: Internal Medicine

## 2022-09-10 ENCOUNTER — Encounter (HOSPITAL_COMMUNITY)
Admission: RE | Disposition: A | Discharge status: Home / Self Care | Payer: Self-pay | Source: Home / Self Care | Attending: Internal Medicine

## 2022-09-10 ENCOUNTER — Encounter (HOSPITAL_COMMUNITY): Payer: Self-pay

## 2022-09-10 ENCOUNTER — Emergency Department (HOSPITAL_COMMUNITY): Payer: Medicaid Other

## 2022-09-10 ENCOUNTER — Emergency Department (HOSPITAL_COMMUNITY)
Admission: EM | Admit: 2022-09-10 | Discharge: 2022-09-11 | Disposition: A | Discharge status: Home / Self Care | Payer: Medicaid Other | Attending: Emergency Medicine | Admitting: Emergency Medicine

## 2022-09-10 DIAGNOSIS — S61511A Laceration without foreign body of right wrist, initial encounter: Secondary | ICD-10-CM | POA: Insufficient documentation

## 2022-09-10 DIAGNOSIS — I471 Supraventricular tachycardia, unspecified: Secondary | ICD-10-CM | POA: Insufficient documentation

## 2022-09-10 DIAGNOSIS — W25XXXA Contact with sharp glass, initial encounter: Secondary | ICD-10-CM | POA: Insufficient documentation

## 2022-09-10 HISTORY — PX: SVT ABLATION: EP1225

## 2022-09-10 SURGERY — SVT ABLATION

## 2022-09-10 MED ORDER — ACETAMINOPHEN 325 MG PO TABS
650.0000 mg | ORAL_TABLET | ORAL | Status: DC | PRN
  Administered 2022-09-10: 650 mg via ORAL
  Filled 2022-09-10: qty 2

## 2022-09-10 MED ORDER — SODIUM CHLORIDE 0.9% FLUSH: 3.0000 mL | INTRAVENOUS | Status: DC | PRN

## 2022-09-10 MED ORDER — MIDAZOLAM HCL 5 MG/5ML IJ SOLN
INTRAMUSCULAR | Status: AC
  Filled 2022-09-10: qty 5

## 2022-09-10 MED ORDER — MIDAZOLAM HCL 5 MG/5ML IJ SOLN
INTRAMUSCULAR | Status: AC
Start: 2022-09-10 — End: ?
  Filled 2022-09-10: qty 5

## 2022-09-10 MED ORDER — MIDAZOLAM HCL 5 MG/5ML IJ SOLN
INTRAMUSCULAR | Status: DC | PRN
  Administered 2022-09-10 (×2): 2 mg via INTRAVENOUS
  Administered 2022-09-10 (×3): 1 mg via INTRAVENOUS

## 2022-09-10 MED ORDER — SODIUM CHLORIDE 0.9 % IV SOLN: INTRAVENOUS | Status: DC

## 2022-09-10 MED ORDER — FENTANYL CITRATE (PF) 100 MCG/2ML IJ SOLN
INTRAMUSCULAR | Status: DC | PRN
Start: 2022-09-10 — End: 2022-09-10
  Administered 2022-09-10 (×2): 12.5 ug via INTRAVENOUS
  Administered 2022-09-10 (×2): 25 ug via INTRAVENOUS
  Administered 2022-09-10: 12.5 ug via INTRAVENOUS

## 2022-09-10 MED ORDER — HEPARIN (PORCINE) IN NACL 1000-0.9 UT/500ML-% IV SOLN
INTRAVENOUS | Status: AC
  Filled 2022-09-10: qty 500

## 2022-09-10 MED ORDER — HEPARIN (PORCINE) IN NACL 1000-0.9 UT/500ML-% IV SOLN
INTRAVENOUS | Status: DC | PRN
  Administered 2022-09-10: 500 mL

## 2022-09-10 MED ORDER — BUPIVACAINE HCL (PF) 0.25 % IJ SOLN
INTRAMUSCULAR | Status: DC | PRN
  Administered 2022-09-10: 30 mL

## 2022-09-10 MED ORDER — ONDANSETRON HCL 4 MG/2ML IJ SOLN: 4.0000 mg | Freq: Four times a day (QID) | INTRAMUSCULAR | Status: DC | PRN

## 2022-09-10 MED ORDER — BUPIVACAINE HCL (PF) 0.25 % IJ SOLN
INTRAMUSCULAR | Status: AC
Start: 2022-09-10 — End: ?
  Filled 2022-09-10: qty 30

## 2022-09-10 MED ORDER — FENTANYL CITRATE (PF) 100 MCG/2ML IJ SOLN
INTRAMUSCULAR | Status: AC
  Filled 2022-09-10: qty 2

## 2022-09-10 MED ORDER — SODIUM CHLORIDE 0.9 % IV SOLN: 250.0000 mL | INTRAVENOUS | Status: DC | PRN

## 2022-09-10 MED ORDER — LIDOCAINE-EPINEPHRINE (PF) 2 %-1:200000 IJ SOLN
10.0000 mL | Freq: Once | INTRAMUSCULAR | Status: AC
  Administered 2022-09-10: 10 mL via INTRADERMAL
  Filled 2022-09-10: qty 20

## 2022-09-10 MED ORDER — SODIUM CHLORIDE 0.9% FLUSH: 3.0000 mL | Freq: Two times a day (BID) | INTRAVENOUS | Status: DC

## 2022-09-10 MED ORDER — ISOPROTERENOL HCL 0.2 MG/ML IJ SOLN
INTRAVENOUS | Status: AC | PRN
  Administered 2022-09-10: 2 ug/min via INTRAVENOUS

## 2022-09-10 MED ORDER — SODIUM CHLORIDE 0.9 % IV SOLN
INTRAVENOUS | Status: DC | PRN
  Administered 2022-09-10: 4 ug/min via INTRAVENOUS

## 2022-09-10 MED ORDER — ISOPROTERENOL HCL 0.2 MG/ML IJ SOLN
INTRAMUSCULAR | Status: AC
  Filled 2022-09-10: qty 5

## 2022-09-10 SURGICAL SUPPLY — 11 items
CATH HEX JOS 2-5-2 65CM 6F REP (CATHETERS) IMPLANT
CATH JOSEPH QUAD ALLRED 6F REP (CATHETERS) IMPLANT
CATH POLARIS X REPROCESSED (CATHETERS) IMPLANT
CATH WEBSTER BI DIR CS D-F CRV (CATHETERS) IMPLANT
PACK EP LATEX FREE (CUSTOM PROCEDURE TRAY) ×1
PACK EP LF (CUSTOM PROCEDURE TRAY) ×1 IMPLANT
PAD DEFIB RADIO PHYSIO CONN (PAD) ×1 IMPLANT
PATCH CARTO3 (PAD) IMPLANT
SHEATH PINNACLE 6F 10CM (SHEATH) IMPLANT
SHEATH PINNACLE 7F 10CM (SHEATH) IMPLANT
SHEATH PINNACLE 8F 10CM (SHEATH) IMPLANT

## 2022-09-10 NOTE — Discharge Instructions (Signed)
Femoral Site Care This sheet gives you information about how to care for yourself after your procedure. Your health care provider may also give you more specific instructions. If you have problems or questions, contact your health care provider. What can I expect after the procedure?  After the procedure, it is common to have: Bruising that usually fades within 1-2 weeks. Tenderness at the site. Follow these instructions at home: Wound care Follow instructions from your health care provider about how to take care of your insertion site. Make sure you: Wash your hands with soap and water before you change your bandage (dressing). If soap and water are not available, use hand sanitizer. Remove your dressing as told by your health care provider. In 24 hours Do not take baths, swim, or use a hot tub until your health care provider approves. You may shower 24-48 hours after the procedure or as told by your health care provider. Gently wash the site with plain soap and water. Pat the area dry with a clean towel. Do not rub the site. This may cause bleeding. Do not apply powder or lotion to the site. Keep the site clean and dry. Check your femoral site every day for signs of infection. Check for: Redness, swelling, or pain. Fluid or blood. Warmth. Pus or a bad smell. Activity For the first 2-3 days after your procedure, or as long as directed: Avoid climbing stairs as much as possible. Do not squat. Do not lift anything that is heavier than 10 lb (4.5 kg), or the limit that you are told, until your health care provider says that it is safe. For 5 days Rest as directed. Avoid sitting for a long time without moving. Get up to take short walks every 1-2 hours. Do not drive for 24 hours if you were given a medicine to help you relax (sedative). General instructions Take over-the-counter and prescription medicines only as told by your health care provider. Keep all follow-up visits as told by  your health care provider. This is important. Contact a health care provider if you have: A fever or chills. You have redness, swelling, or pain around your insertion site. Get help right away if: The catheter insertion area swells very fast. You pass out. You suddenly start to sweat or your skin gets clammy. The catheter insertion area is bleeding, and the bleeding does not stop when you hold steady pressure on the area. The area near or just beyond the catheter insertion site becomes pale, cool, tingly, or numb. These symptoms may represent a serious problem that is an emergency. Do not wait to see if the symptoms will go away. Get medical help right away. Call your local emergency services (911 in the [place] Do not drive yourself to the hospital. Summary After the procedure, it is common to have bruising that usually fades within 1-2 weeks. Check your femoral site every day for signs of infection. Do not lift anything that is heavier than 10 lb (4.5 kg), or the limit that you are told, until your health care provider says that it is safe. This information is not intended to replace advice given to you by your health care provider. Make sure you discuss any questions you have with your health care provider. Document Revised: 10/28/2017 Document Reviewed: 10/28/2017 Elsevier Patient Education  2020 Elsevier Inc. 

## 2022-09-10 NOTE — H&P (Signed)
HPI [name] returns today for followup. He is a pleasant 21 yo man with palpitations. He has worn a cardiac monitor which demonstrates what appears to be SVT although there is some artifact. He notes that his episodes start suddenly and will stop either suddenly or gradually. He has never had syncope. He does not have chest pain or sob.  No Known Allergies           Current Outpatient Medications  Medication Sig Dispense Refill   acetaminophen (TYLENOL) 500 MG tablet Take 1,000 mg by mouth every 6 (six) hours as needed for mild pain or headache.       metoprolol succinate (TOPROL XL) 25 MG 24 hr tablet Take 1 tablet (25 mg total) by mouth daily. 30 tablet 11    No current facility-administered medications for this visit.            Past Medical History:  Diagnosis Date   Abnormal EKG        ROS:    All systems reviewed and negative except as noted in the HPI.          Past Surgical History:  Procedure Laterality Date   KNEE ARTHROSCOPY WITH DRILLING/MICROFRACTURE Left 11/21/2018    Procedure: MICROFRACTURE;  Surgeon: Sheral Apley, MD;  Location: [facility];  Service: Orthopedics;  Laterality: Left;   KNEE ARTHROSCOPY WITH MENISCAL REPAIR Left 11/21/2018    Procedure: KNEE ARTHROSCOPY WITH MEDIAL MENISCAL REPAIR;  Surgeon: Sheral Apley, MD;  Location: [facility];  Service: Orthopedics;  Laterality: Left;   ORCHIOPEXY Right 11/26/2018    Procedure: BILATERAL ORCHIOPEXY PEDIATRIC RIGHT TESTICULAR EXPLORATION;  Surgeon: Kandice Hams, MD;  Location: [facility];  Service: Pediatrics;  Laterality: Right;             Family History  Problem Relation Age of Onset   Heart murmur Maternal Grandfather     Heart attack Other          Social History         Socioeconomic History   Marital status: Single      Spouse name: Not on file   Number of children: Not on file   Years of education: Not on file   Highest education  level: Not on file  Occupational History   Not on file  Tobacco Use   Smoking status: Never      Passive exposure: Never   Smokeless tobacco: Never  Vaping Use   Vaping Use: Never used  Substance and Sexual Activity   Alcohol use: Never   Drug use: Not Currently   Sexual activity: Yes  Other Topics Concern   Not on file  Social History Narrative   Not on file    Social Determinants of Health    Financial Resource Strain: Not on file  Food Insecurity: Not on file  Transportation Needs: Not on file  Physical Activity: Not on file  Stress: Not on file  Social Connections: Not on file  Intimate Partner Violence: Not on file        BP 136/64   Pulse 60   Ht 6\' 2"  (1.88 m)   Wt 178 lb (80.7 kg)   SpO2 97%   BMI 22.85 kg/m    Physical Exam:   Well appearing NAD HEENT: Unremarkable Neck:  No JVD, no thyromegally Lymphatics:  No adenopathy Back:  No CVA tenderness Lungs:  Clear with no  wheezes HEART:  Regular rate rhythm, no murmurs, no rubs, no clicks Abd:  soft, positive bowel sounds, no organomegally, no rebound, no guarding Ext:  2 plus pulses, no edema, no cyanosis, no clubbing Skin:  No rashes no nodules Neuro:  CN II through XII intact, motor grossly intact   EKG - nsr with no preexcitation. J point elevation   Assess/Plan:  Palpitations, probable SVT - I have discussed the treatment options with the patient and his parents. It looks like he has SVT but cannot definitively exclude sinus tachycardia. He has been prescribed low dose toprol. He can continue this or undergo EP study and ablation. The pro's and con's, risks/benefits/goals/expectations were reviewed with the patient and he will call us if he wishes to proceed.     Sharlot Gowda Miguel Fedie,MD

## 2022-09-10 NOTE — ED Provider Triage Note (Signed)
Emergency Medicine Provider Triage Evaluation Note  Miguel Johnson , a 21 y.o. male  was evaluated in triage.  Pt complains of laceration on right wrist.  Patient was putting up a mirror and it broke, slid down the right wrist.  Patient has a skin flap on the right wrist.  Bleeding is controlled. Not on blood thinner. Not sure about his last tetanus shot.  Review of Systems  Positive: As above Negative: As above  Physical Exam  SpO2 100%  Gen:   Awake, no distress   Resp:  Normal effort  MSK:   Moves extremities without difficulty  Other:    Medical Decision Making  Medically screening exam initiated at 10:17 PM.  Appropriate orders placed.  Miguel Johnson was informed that the remainder of the evaluation will be completed by another provider, this initial triage assessment does not replace that evaluation, and the importance of remaining in the ED until their evaluation is complete.     Miguel Johnson, [place] 09/11/22 [phone]

## 2022-09-10 NOTE — Progress Notes (Signed)
SITE AREA: right neck  SITE PRIOR TO REMOVAL:  LEVEL 0  PRESSURE APPLIED FOR: approximately 10 minutes  MANUAL: yes   PATIENT STATUS DURING PULL: stable  POST PULL SITE:  LEVEL 0  POST PULL INSTRUCTIONS GIVEN: yes  POST PULL PULSES PRESENT: bilateral radial pulses at +2, palpable  DRESSING APPLIED: vaseline gauze with 4x4 gauze and tegaderm placed, vaseline gauze used with 4x4 while holding manual pressure  COMMENTS:

## 2022-09-10 NOTE — Progress Notes (Signed)
SITE AREA: right groin  SITE PRIOR TO REMOVAL:  LEVEL 0  PRESSURE APPLIED FOR: approximately 12 minutes  MANUAL: yes  PATIENT STATUS DURING PULL: stable   POST PULL SITE:  LEVEL 0   POST PULL INSTRUCTIONS GIVEN: yes  POST PULL PULSES PRESENT: bilateral pedal pulses at +2, palpable  DRESSING APPLIED: gauze with tegaderm  BEDREST BEGINS @1032   COMMENTS:

## 2022-09-10 NOTE — ED Triage Notes (Signed)
Pt was putting up a mirror and it broke. Slid down the wrist on the rt side. Pt has a horseshoe shape laceration on the medial aspect of the rt wrist.

## 2022-09-11 ENCOUNTER — Encounter (HOSPITAL_COMMUNITY): Payer: Self-pay | Admitting: Internal Medicine

## 2022-09-11 MED ORDER — AMOXICILLIN-POT CLAVULANATE 875-125 MG PO TABS: 1.0000 | ORAL_TABLET | Freq: Two times a day (BID) | ORAL | 0 refills | Status: AC

## 2022-09-11 NOTE — ED Provider Notes (Signed)
[facility]-EMERGENCY DEPT Provider Note   CSN: [id number] Arrival date & time: 09/10/22  2208     History  Chief Complaint  Patient presents with   Laceration    Miguel Johnson is a 21 y.o. male who presents with laceration to right medial wrist sustained after a mirror broke when he was trying to hang it, cutting his arm on its way down. He denies numbness, tingling, weakness in the  hand following injury, states he is UTD on tetanus.   I have personally reviewed his medical records.He has history of SVT underwent unsuccessful ablation attempt earlier today.   HPI     Home Medications Prior to Admission medications   Medication Sig Start Date End Date Taking? Authorizing Provider  metoprolol succinate (TOPROL XL) 25 MG 24 hr tablet Take 1 tablet (25 mg total) by mouth daily. Patient not taking: Reported on 09/04/2022 06/11/22   Miguel Stade, MD      Allergies    Patient has no known allergies.    Review of Systems   Review of Systems  Skin:  Positive for wound.    Physical Exam Updated Vital Signs BP (!) 154/79 (BP Location: Right Arm)   Pulse (!) 58   Temp 98.7 F (37.1 C) (Oral)   Resp 18   Ht 6\' 2"  (1.88 m)   Wt 76.7 kg   SpO2 100%   BMI 21.70 kg/m  Physical Exam Vitals and nursing note reviewed.  Constitutional:      Appearance: He is not ill-appearing or toxic-appearing.  HENT:     Head: Normocephalic and atraumatic.  Eyes:     General: No scleral icterus.       Right eye: No discharge.        Left eye: No discharge.     Conjunctiva/sclera: Conjunctivae normal.  Neck:   Pulmonary:     Effort: Pulmonary effort is normal.  Musculoskeletal:       Hands:  Skin:    General: Skin is warm and dry.  Neurological:     General: No focal deficit present.     Mental Status: He is alert.  Psychiatric:        Mood and Affect: Mood normal.     ED Results / Procedures / Treatments   Labs (all labs ordered are listed, but  only abnormal results are displayed) Labs Reviewed - No data to display  EKG None  Radiology DG Wrist Complete Right  Result Date: 09/10/2022 CLINICAL DATA:  Laceration anterior right wrist EXAM: RIGHT WRIST - COMPLETE 3+ VIEW COMPARISON:  None Available. FINDINGS: Frontal, oblique, lateral, and ulnar deviated views of the right wrist are obtained. No fracture, subluxation, or dislocation. Joint spaces are well preserved. Soft tissue swelling within the ventral wrist. No radiopaque foreign body. IMPRESSION: 1. Ventral soft tissue swelling. No underlying fracture or radiopaque foreign body. Electronically Signed   By: 09/12/2022 M.D.   On: 09/10/2022 23:08   EP STUDY  Result Date: 09/10/2022 Conclusion: Invasive EP study demonstrating no inducible SVT despite an aggressive pacing protocol utilizing isoproterenol.    Procedures .11/15/2023Laceration Repair  Date/Time: 09/11/2022 12:35 AM  Performed by: 09/13/2022, PA-C Authorized by: Miguel Lore, PA-C   Consent:    Consent obtained:  Verbal   Consent given by:  Patient   Risks, benefits, and alternatives were discussed: yes     Risks discussed:  Infection, need for additional repair, poor cosmetic result, poor wound  healing, pain and vascular damage   Alternatives discussed:  Delayed treatment, observation and no treatment Universal protocol:    Procedure explained and questions answered to patient or proxy's satisfaction: yes     Relevant documents present and verified: yes     Test results available: yes     Imaging studies available: yes     Patient identity confirmed:  Verbally with patient and arm band Anesthesia:    Anesthesia method:  Local infiltration   Local anesthetic:  Lidocaine 2% WITH epi Laceration details:    Location:  Hand   Hand location:  R wrist   Length (cm):  3   Laceration depth: Tendon visible but no tendon injury, no vascular injury. Exploration:    Hemostasis achieved with:  Direct  pressure and epinephrine   Imaging obtained: x-ray     Imaging outcome: foreign body not noted     Wound exploration: wound explored through full range of motion and entire depth of wound visualized     Wound extent: no foreign body, no signs of injury, no tendon damage, no underlying fracture and no vascular damage     Contaminated: no   Treatment:    Area cleansed with:  Saline   Amount of cleaning:  Extensive   Irrigation solution:  Sterile saline   Irrigation method:  Pressure wash   Debridement:  None Skin repair:    Repair method:  Sutures   Suture size:  4-0   Suture material:  Prolene   Suture technique:  Simple interrupted and horizontal mattress   Number of sutures:  5 (HM x 2, SI x 3) Approximation:    Approximation:  Close Repair type:    Repair type:  Intermediate Post-procedure details:    Dressing:  Antibiotic ointment, bulky dressing and non-adherent dressing   Procedure completion:  Tolerated well, no immediate complications     Medications Ordered in ED Medications  lidocaine-EPINEPHrine (XYLOCAINE W/EPI) 2 %-1:200000 (PF) injection 10 mL (10 mLs Intradermal Given 09/10/22 2325)    ED Course/ Medical Decision Making/ A&P                           Medical Decision Making 21 y/o male with laceration to right medial wrist.   VS normal. Laceration as above. Normal neurovascaular status and FROM distal to the wound as well as in the right wrist. No vascular injury   Amount and/or Complexity of Data Reviewed Radiology: ordered.    Details: NO FB or fracture.   Risk Prescription drug management.   Wound repaired as above. Patient tolerated procedure well. Will d/c with prophylactic abx given exposure of tendon. Patient remained neurovascularly intact following repair.   No further workup warranted in the ED at this time. Miguel Johnson  voiced understanding of his medical evaluation and treatment plan. Each of their questions answered to their expressed  satisfaction.  Return precautions were given.  Patient is well-appearing, stable, and was discharged in good condition.  This chart was dictated using voice recognition software, Dragon. Despite the best efforts of this provider to proofread and correct errors, errors may still occur which can change documentation meaning.   Final Clinical Impression(s) / ED Diagnoses Final diagnoses:  None    Rx / DC Orders ED Discharge Orders     None         Miguel LoreSponseller, Sherica Paternostro R, PA-C 09/11/22 0041    Sloan LeiterGray, Samuel A, DO 09/13/22 1421

## 2022-09-11 NOTE — Discharge Instructions (Signed)
You were seen in the ER today for your wound. This was repaired with 5 stitches that will need to be removed in 7-10 days. You may present to urgent care, PCP, or the ED for suture removal. Please dress the wound daily with antibiotic ointment and dry clean bandage daily. Take the prescribed antibiotics for the entire course and return to the ER if you develop any redness, swelling, pus-like drainage from the area or any other new severe symptoms.

## 2022-09-19 ENCOUNTER — Ambulatory Visit
Admission: EM | Admit: 2022-09-19 | Discharge: 2022-09-19 | Disposition: A | Discharge status: Home / Self Care | Payer: Medicaid Other | Attending: Emergency Medicine | Admitting: Emergency Medicine

## 2022-09-19 DIAGNOSIS — Z4802 Encounter for removal of sutures: Secondary | ICD-10-CM | POA: Diagnosis not present

## 2022-09-19 NOTE — ED Provider Notes (Signed)
[facility]    CSN: [id number] Arrival date & time: 09/19/22  1831    HISTORY   Chief Complaint  Patient presents with   Suture / Staple Removal   HPI Miguel Johnson is a pleasant, 21 y.o. male who presents to urgent care today. Patient presents to urgent care to have 2 horizontal mattress and 3 simple interrupted stitches removed from his right wrist where he sustained a laceration 9 days ago, patient went to the emergency room at [facility] for laceration repair.  Patient reports no concerns today.    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   Past Surgical History:  Procedure Laterality Date   KNEE ARTHROSCOPY WITH DRILLING/MICROFRACTURE Left 11/21/2018   Procedure: MICROFRACTURE;  Surgeon: Sheral Apley, MD;  Location: [facility];  Service: Orthopedics;  Laterality: Left;   KNEE ARTHROSCOPY WITH MENISCAL REPAIR Left 11/21/2018   Procedure: KNEE ARTHROSCOPY WITH MEDIAL MENISCAL REPAIR;  Surgeon: Sheral Apley, MD;  Location: [facility];  Service: Orthopedics;  Laterality: Left;   ORCHIOPEXY Right 11/26/2018   Procedure: BILATERAL ORCHIOPEXY PEDIATRIC RIGHT TESTICULAR EXPLORATION;  Surgeon: Kandice Hams, MD;  Location: [facility];  Service: Pediatrics;  Laterality: Right;   SVT ABLATION N/A 09/10/2022   Procedure: SVT ABLATION;  Surgeon: Marinus Maw, MD;  Location: [facility] INVASIVE CV LAB;  Service: Cardiovascular;  Laterality: N/A;    Home Medications    Prior to Admission medications   Medication Sig Start Date End Date Taking? Authorizing Provider    Family History Family History  Problem Relation Age of Onset   Heart murmur  Maternal Grandfather    Heart attack Other    Social History Social History   Tobacco Use   Smoking status: Never    Passive exposure: Never   Smokeless tobacco: Never  Vaping Use   Vaping Use: Never used  Substance Use Topics   Alcohol use: Never   Drug use: Not Currently   Allergies   Patient has no known allergies.  Review of Systems Review of Systems Pertinent findings revealed after performing a 14 point review of systems has been noted in the history of present illness.  Physical Exam Triage Vital Signs ED Triage Vitals  Enc Vitals Group     BP 08/25/21 0827 (!) 147/82     Pulse Rate 08/25/21 0827 72     Resp 08/25/21 0827 18     Temp 08/25/21 0827 98.3 F (36.8 C)     Temp Source 08/25/21 0827 Oral     SpO2 08/25/21 0827 98 %     Weight --      Height --      Head Circumference --      Peak Flow --      Pain Score 08/25/21 0826 5     Pain Loc --      Pain Edu? --      Excl. in GC? --   No data found.  Updated Vital Signs BP 116/68 (BP Location: Left Arm)   Pulse 67   Temp 98.2 F (36.8 C) (Oral)   Resp 16   SpO2 95%   Physical Exam Vitals and nursing note reviewed.  Constitutional:      General: He is not in acute distress.    Appearance: Normal appearance. He is normal weight. He is not ill-appearing.  HENT:     Head: Normocephalic and atraumatic.  Skin:    General: Skin is warm and dry.     Findings: Lesion (Well-healed laceration on right wrist, 2 horizontal mattress and 3 simple interrupted sutures in place, wound is without surrounding erythema, drainage or signs of dehiscence) present.  Neurological:     General: No focal deficit present.     Mental Status: He is alert and oriented to person, place, and time. Mental status is at baseline.  Psychiatric:        Mood and Affect: Mood normal.        Behavior: Behavior normal.        Thought Content: Thought content normal.        Judgment: Judgment normal.     Visual Acuity Right Eye  Distance:   Left Eye Distance:   Bilateral Distance:    Right Eye Near:   Left Eye Near:    Bilateral Near:     UC Couse / Diagnostics / Procedures:     Radiology No results found.  Procedures Procedures (including critical care time) EKG  Pending results:  Labs Reviewed - No data to display  Medications Ordered in UC: Medications - No data to display  UC Diagnoses / Final Clinical Impressions(s)   I have reviewed the triage vital signs and the nursing notes.  Pertinent labs & imaging results that were available during my care of the patient were reviewed by me and considered in my medical decision making (see chart for details).    Final diagnoses:  Visit for suture removal   Sutures removed without incident.  ED Prescriptions   None    PDMP not reviewed this encounter.  Pending results:  Labs Reviewed - No data to display  Discharge Instructions:   Discharge Instructions      Please see enclosed instructions regarding care of wounds after suture removal.      Disposition Upon Discharge:  Condition: stable for discharge home  Patient presented with an acute illness with associated systemic symptoms and significant discomfort requiring urgent management. In my opinion, this is a condition that a prudent lay person (someone who possesses an average knowledge of health and medicine) may potentially expect to result in complications if not addressed urgently such as respiratory distress, impairment of bodily function or dysfunction of bodily organs.   Routine symptom specific, illness specific and/or disease specific instructions were discussed with the patient and/or caregiver at length.   As such, the patient has been evaluated and assessed, work-up was performed and treatment was provided in alignment with urgent care protocols and evidence based medicine.  Patient/parent/caregiver has been advised that the patient may require follow up for further  testing and treatment if the symptoms continue in spite of treatment, as clinically indicated and appropriate.  Patient/parent/caregiver has been advised to return to the [facility] or PCP if no better; to PCP or the Emergency Department if new signs and symptoms develop, or if the current signs or symptoms continue to change or worsen for further workup, evaluation and treatment as clinically indicated and appropriate  The patient will follow up with their current PCP if and as advised. If the patient does not currently have a PCP we will assist them in obtaining one.   The patient may need specialty follow up  if the symptoms continue, in spite of conservative treatment and management, for further workup, evaluation, consultation and treatment as clinically indicated and appropriate.   Patient/parent/caregiver verbalized understanding and agreement of plan as discussed.  All questions were addressed during visit.  Please see discharge instructions below for further details of plan.  This office note has been dictated using Museum/gallery curator.  Unfortunately, this method of dictation can sometimes lead to typographical or grammatical errors.  I apologize for your inconvenience in advance if this occurs.  Please do not hesitate to reach out to me if clarification is needed.      Lynden Oxford Scales, PA-C 09/19/22 1948

## 2022-09-19 NOTE — ED Triage Notes (Addendum)
Pt seen for suture removal. Placed at [facility] ED. Sutures was placed 9 days (09/10/22) ago on the right wrist. Denies pain. No redness or swelling.   Right wrist has 5 sutures

## 2022-09-19 NOTE — Discharge Instructions (Signed)
Please see enclosed instructions regarding care of wounds after suture removal.

## 2022-10-02 NOTE — Progress Notes (Incomplete)
CARDIOLOGY CONSULT NOTE       Patient ID: Miguel Johnson MRN: [id number] DOB/AGE: 04/04/2001 21 y.o.  Primary Physician: Pritt, Jodelle Gross, MD Primary Cardiologist: Eden Emms EP Physician:  Ladona Ridgel   HPI:  21 y.o. first seen in hospital 09/11/21 for palpitations and abnormal eCG ECG showed prominent voltage with J point elevation in inferior lateral leads likely early repolarization. TTE 11/15 EF 55-60% normal RV no valve dx and no significant LVH No high risk family history Monitor 10/09/21 did not show any significant arrhythmia but repeat 06/11/22 showed multiple episodes SVT > 1 minute Seen by Dr Ladona Ridgel started on beta blocker Ablation procedure 09/11/22 with no inducible atrial or ventricular arrhythmias despite aggressive protocol and isuprel He was seen in ED latter in month for right wrist laceration requiring sutures Occurred putting mirror up and it broke  ***   ROS All other systems reviewed and negative except as noted above  Past Medical History:  Diagnosis Date  . Abnormal EKG     Family History  Problem Relation Age of Onset  . Heart murmur Maternal Grandfather   . Heart attack Other     Social History   Socioeconomic History  . Marital status: Single    Spouse name: Not on file  . Number of children: Not on file  . Years of education: Not on file  . Highest education level: Not on file  Occupational History  . Not on file  Tobacco Use  . Smoking status: Never    Passive exposure: Never  . Smokeless tobacco: Never  Vaping Use  . Vaping Use: Never used  Substance and Sexual Activity  . Alcohol use: Never  . Drug use: Not Currently  . Sexual activity: Yes  Other Topics Concern  . Not on file  Social History Narrative  . Not on file   Social Determinants of Health   Financial Resource Strain: Not on file  Food Insecurity: Not on file  Transportation Needs: Not on file  Physical Activity: Not on file  Stress: Not on file  Social Connections: Not on  file  Intimate Partner Violence: Not on file    Past Surgical History:  Procedure Laterality Date  . KNEE ARTHROSCOPY WITH DRILLING/MICROFRACTURE Left 11/21/2018   Procedure: MICROFRACTURE;  Surgeon: Sheral Apley, MD;  Location: [facility];  Service: Orthopedics;  Laterality: Left;  . KNEE ARTHROSCOPY WITH MENISCAL REPAIR Left 11/21/2018   Procedure: KNEE ARTHROSCOPY WITH MEDIAL MENISCAL REPAIR;  Surgeon: Sheral Apley, MD;  Location: [facility];  Service: Orthopedics;  Laterality: Left;  . ORCHIOPEXY Right 11/26/2018   Procedure: BILATERAL ORCHIOPEXY PEDIATRIC RIGHT TESTICULAR EXPLORATION;  Surgeon: Kandice Hams, MD;  Location: [facility];  Service: Pediatrics;  Laterality: Right;  . SVT ABLATION N/A 09/10/2022   Procedure: SVT ABLATION;  Surgeon: Marinus Maw, MD;  Location: [facility] INVASIVE CV LAB;  Service: Cardiovascular;  Laterality: N/A;     No current outpatient medications on file.    Physical Exam: There were no vitals taken for this visit.   Affect appropriate Healthy:  appears stated age HEENT: normal Neck supple with no adenopathy JVP normal no bruits no thyromegaly Lungs clear with no wheezing and good diaphragmatic motion Heart:  S1/S2 no murmur, no rub, gallop or click PMI normal Abdomen: benighn, BS positve, no tenderness, no AAA no bruit.  No HSM or HJR Distal pulses intact with no bruits No edema Neuro non-focal Right wrist laceration healing  No  muscular weakness   Labs:   Lab Results  Component Value Date   WBC 6.6 08/20/2022   HGB 14.3 08/20/2022   HCT 42.7 08/20/2022   MCV 77 (L) 08/20/2022   PLT 248 08/20/2022   No results for input(s): "NA", "K", "CL", "CO2", "BUN", "CREATININE", "CALCIUM", "PROT", "BILITOT", "ALKPHOS", "ALT", "AST", "GLUCOSE" in the last 168 hours.  Invalid input(s): "LABALBU" No results found for: "CKTOTAL", "CKMB", "CKMBINDEX", "TROPONINI" No results found for: "CHOL" No results found for:  "HDL" No results found for: "LDLCALC" No results found for: "TRIG" No results found for: "CHOLHDL" No results found for: "LDLDIRECT"    Radiology: EP STUDY  Result Date: 09/11/2022 Conclusion: Invasive EP study demonstrating no inducible SVT despite an aggressive pacing protocol utilizing isoproterenol.   DG Wrist Complete Right  Result Date: 09/10/2022 CLINICAL DATA:  Laceration anterior right wrist EXAM: RIGHT WRIST - COMPLETE 3+ VIEW COMPARISON:  None Available. FINDINGS: Frontal, oblique, lateral, and ulnar deviated views of the right wrist are obtained. No fracture, subluxation, or dislocation. Joint spaces are well preserved. Soft tissue swelling within the ventral wrist. No radiopaque foreign body. IMPRESSION: 1. Ventral soft tissue swelling. No underlying fracture or radiopaque foreign body. Electronically Signed   By: Sharlet Salina M.D.   On: 09/10/2022 23:08    EKG: see HPI   ASSESSMENT AND PLAN:   Palpitations: ? SVT not inducible on EP study with Dr Ladona Ridgel *** Abnormal ECG:  see HPI normal variant TTE with no abnormality and no LVH/HOCM Laceration:  right wrist sutures removed 09/19/22  healing  F/U PRN Dr Ladona Ridgel if any further palpitations/arrhythmias   Signed: Charlton Haws 10/02/2022, 2:43 PM

## 2022-10-08 ENCOUNTER — Ambulatory Visit: Payer: Medicaid Other | Attending: Internal Medicine | Admitting: Internal Medicine

## 2022-10-08 VITALS — BP 136/78 | HR 64 | Wt 177.0 lb

## 2022-10-08 DIAGNOSIS — R002 Palpitations: Secondary | ICD-10-CM | POA: Diagnosis not present

## 2022-10-08 DIAGNOSIS — I471 Supraventricular tachycardia, unspecified: Secondary | ICD-10-CM | POA: Diagnosis not present

## 2022-10-08 NOTE — Patient Instructions (Addendum)
Medication Instructions:  Your physician recommends that you continue on your current medications as directed. Please refer to the Current Medication list given to you today.  *If you need a refill on your cardiac medications before your next appointment, please call your pharmacy*  Lab Work: None ordered.  If you have labs (blood work) drawn today and your tests are completely normal, you will receive your results only by: MyChart Message (if you have MyChart) OR A paper copy in the mail If you have any lab test that is abnormal or we need to change your treatment, we will call you to review the results.  Testing/Procedures: None ordered.  Follow-Up: At [facility], you and your health needs are our priority.  As part of our continuing mission to provide you with exceptional heart care, we have created designated Provider Care Teams.  These Care Teams include your primary Cardiologist (physician) and Advanced Practice Providers (APPs -  Physician Assistants and Nurse Practitioners) who all work together to provide you with the care you need, when you need it.  We recommend signing up for the patient portal called "MyChart".  Sign up information is provided on this After Visit Summary.  MyChart is used to connect with patients for Virtual Visits (Telemedicine).  Patients are able to view lab/test results, encounter notes, upcoming appointments, etc.  Non-urgent messages can be sent to your provider as well.   To learn more about what you can do with MyChart, go to [email or web address].com.    Your next appointment:   AS NEEDED  The format for your next appointment:   In Person  Provider:   Gregg Taylor, MD{or one of the following Advanced Practice Providers on your designated Care Team:   Renee Ursuy, PA-C Michael "Andy" Tillery, PA-C  Important Information About Sugar        

## 2022-10-08 NOTE — Progress Notes (Signed)
HPI [name] returns today for followup. He is a pleasant 21 yo man with palpitations and fount to have SVT on a heart monitor but at EP study had no inducible SVT. He has done well in the interim with no problems healing from his catheter insertion site. He thinks that his palpitations are improved. He is back to work without problems. No chest pain or sob.  No Known Allergies   No current outpatient medications on file.   No current facility-administered medications for this visit.     Past Medical History:  Diagnosis Date   Abnormal EKG     ROS:   All systems reviewed and negative except as noted in the HPI.   Past Surgical History:  Procedure Laterality Date   KNEE ARTHROSCOPY WITH DRILLING/MICROFRACTURE Left 11/21/2018   Procedure: MICROFRACTURE;  Surgeon: Sheral Apley, MD;  Location: [facility];  Service: Orthopedics;  Laterality: Left;   KNEE ARTHROSCOPY WITH MENISCAL REPAIR Left 11/21/2018   Procedure: KNEE ARTHROSCOPY WITH MEDIAL MENISCAL REPAIR;  Surgeon: Sheral Apley, MD;  Location: [facility];  Service: Orthopedics;  Laterality: Left;   ORCHIOPEXY Right 11/26/2018   Procedure: BILATERAL ORCHIOPEXY PEDIATRIC RIGHT TESTICULAR EXPLORATION;  Surgeon: Kandice Hams, MD;  Location: [facility];  Service: Pediatrics;  Laterality: Right;   SVT ABLATION N/A 09/10/2022   Procedure: SVT ABLATION;  Surgeon: Marinus Maw, MD;  Location: [facility];  Service: Cardiovascular;  Laterality: N/A;     Family History  Problem Relation Age of Onset   Heart murmur Maternal Grandfather    Heart attack Other      Social History   Socioeconomic History   Marital status: Single    Spouse name: Not on file   Number of children: Not on file   Years of education: Not on file   Highest education level: Not on file  Occupational History   Not on file  Tobacco Use   Smoking status: Never    Passive exposure: Never   Smokeless  tobacco: Never  Vaping Use   Vaping Use: Never used  Substance and Sexual Activity   Alcohol use: Never   Drug use: Not Currently   Sexual activity: Yes  Other Topics Concern   Not on file  Social History Narrative   Not on file   Social Determinants of Health   Financial Resource Strain: Not on file  Food Insecurity: Not on file  Transportation Needs: Not on file  Physical Activity: Not on file  Stress: Not on file  Social Connections: Not on file  Intimate Partner Violence: Not on file     BP 136/78 (BP Location: Right Arm)   Pulse 64   Wt 177 lb 0.2 oz (80.3 kg)   SpO2 96%   BMI 22.73 kg/m   Physical Exam:  Well appearing 21 yo man, NAD HEENT: Unremarkable Neck:  No JVD, no thyromegally Lymphatics:  No adenopathy Back:  No CVA tenderness Lungs:  Clear with no wheezes HEART:  Regular rate rhythm, no murmurs, no rubs, no clicks Abd:  soft, positive bowel sounds, no organomegally, no rebound, no guarding Ext:  2 plus pulses, no edema, no cyanosis, no clubbing Skin:  No rashes no nodules Neuro:  CN II through XII intact, motor grossly intact  Assess/Plan: SVT - he was non-inducible at EP study. I have recommended watchful waiting. He is encouraged to avoid caffeine and ETOH as much as possible  and to avoid any stimulants.   Miguel Johnson Miguel Luallen,MD

## 2022-10-11 ENCOUNTER — Ambulatory Visit: Payer: Medicaid Other | Admitting: Cardiovascular Disease

## 2022-11-10 NOTE — Progress Notes (Signed)
CARDIOLOGY CONSULT NOTE       Patient ID: Miguel Johnson MRN: [id number] DOB/AGE: 01-25-2001 22 y.o.  Primary Physician: Pritt, Lupita Raider, MD Primary Cardiologist: Johnsie Cancel EP Physician:  Lovena Le   HPI:  22 y.o. first seen in hospital 09/11/21 for palpitations and abnormal eCG ECG showed prominent voltage with J point elevation in inferior lateral leads likely early repolarization. TTE 11/15 EF 55-60% normal RV no valve dx and no significant LVH No high risk family history Monitor 10/09/21 did not show any significant arrhythmia but repeat 06/11/22 showed multiple episodes SVT > 1 minute Seen by Dr Lovena Le started on beta blocker Ablation procedure 09/11/22 with no inducible atrial or ventricular arrhythmias despite aggressive protocol and isuprel He was seen in ED latter in month for right wrist laceration requiring sutures Occurred putting mirror up and it broke  Doing well lives with his brother Working at [employer] on campus at [place] other systems reviewed and negative except as noted above  Past Medical History:  Diagnosis Date   Abnormal EKG     Family History  Problem Relation Age of Onset   Heart murmur Maternal Grandfather    Heart attack Other     Social History   Socioeconomic History   Marital status: Single    Spouse name: Not on file   Number of children: Not on file   Years of education: Not on file   Highest education level: Not on file  Occupational History   Not on file  Tobacco Use   Smoking status: Never    Passive exposure: Never   Smokeless tobacco: Never  Vaping Use   Vaping Use: Never used  Substance and Sexual Activity   Alcohol use: Never   Drug use: Not Currently   Sexual activity: Yes  Other Topics Concern   Not on file  Social History Narrative   Not on file   Social Determinants of Health   Financial Resource Strain: Not on file  Food Insecurity: Not on file  Transportation Needs: Not on file  Physical Activity: Not on  file  Stress: Not on file  Social Connections: Not on file  Intimate Partner Violence: Not on file    Past Surgical History:  Procedure Laterality Date   KNEE ARTHROSCOPY WITH DRILLING/MICROFRACTURE Left 11/21/2018   Procedure: MICROFRACTURE;  Surgeon: Renette Butters, MD;  Location: [place];  Service: Orthopedics;  Laterality: Left;   KNEE ARTHROSCOPY WITH MENISCAL REPAIR Left 11/21/2018   Procedure: KNEE ARTHROSCOPY WITH MEDIAL MENISCAL REPAIR;  Surgeon: Renette Butters, MD;  Location: [facility];  Service: Orthopedics;  Laterality: Left;   ORCHIOPEXY Right 11/26/2018   Procedure: BILATERAL ORCHIOPEXY PEDIATRIC RIGHT TESTICULAR EXPLORATION;  Surgeon: Stanford Scotland, MD;  Location: [place];  Service: Pediatrics;  Laterality: Right;   SVT ABLATION N/A 09/10/2022   Procedure: SVT ABLATION;  Surgeon: Evans Lance, MD;  Location: [place] [facility];  Service: Cardiovascular;  Laterality: N/A;     No current outpatient medications on file.    Physical Exam: Blood pressure 118/72, pulse (!) 59, height 6\' 2"  (1.88 m), weight 172 lb 3.2 oz (78.1 kg), SpO2 99 %.   Affect appropriate Healthy:  appears stated age 22: normal Neck supple with no adenopathy JVP normal no bruits no thyromegaly Lungs clear with no wheezing and good diaphragmatic motion Heart:  S1/S2 no murmur, no rub, gallop or click PMI normal Abdomen: benighn, BS positve, no tenderness,  no AAA no bruit.  No HSM or HJR Distal pulses intact with no bruits No edema Neuro non-focal Right wrist laceration healing  No muscular weakness   Labs:   Lab Results  Component Value Date   WBC 6.6 08/20/2022   HGB 14.3 08/20/2022   HCT 42.7 08/20/2022   MCV 77 (L) 08/20/2022   PLT 248 08/20/2022   No results for input(s): "NA", "K", "CL", "CO2", "BUN", "CREATININE", "CALCIUM", "PROT", "BILITOT", "ALKPHOS", "ALT", "AST", "GLUCOSE" in the last 168 hours.  Invalid input(s): "LABALBU" No  results found for: "CKTOTAL", "CKMB", "CKMBINDEX", "TROPONINI" No results found for: "CHOL" No results found for: "HDL" No results found for: "LDLCALC" No results found for: "TRIG" No results found for: "CHOLHDL" No results found for: "LDLDIRECT"    Radiology: No results found.  EKG: see HPI   ASSESSMENT AND PLAN:   Palpitations: ? SVT not inducible on EP study with Dr Lovena Le   Abnormal ECG:  see HPI normal variant TTE with no abnormality and no LVH/HOCM Laceration:  right wrist sutures removed 09/19/22  healing  F/U PRN Dr Lovena Le if any further palpitations/arrhythmias   Signed: Jenkins Rouge 11/21/2022, 10:12 AM

## 2022-11-21 ENCOUNTER — Encounter: Payer: Self-pay | Admitting: Cardiovascular Disease

## 2022-11-21 ENCOUNTER — Ambulatory Visit: Payer: Medicaid Other | Attending: Cardiovascular Disease | Admitting: Cardiovascular Disease

## 2022-11-21 VITALS — BP 118/72 | HR 59 | Ht 74.0 in | Wt 172.2 lb

## 2022-11-21 DIAGNOSIS — R9431 Abnormal electrocardiogram [ECG] [EKG]: Secondary | ICD-10-CM | POA: Diagnosis not present

## 2022-11-21 DIAGNOSIS — I471 Supraventricular tachycardia, unspecified: Secondary | ICD-10-CM | POA: Diagnosis not present

## 2022-11-21 DIAGNOSIS — R002 Palpitations: Secondary | ICD-10-CM | POA: Diagnosis not present

## 2022-11-21 NOTE — Patient Instructions (Signed)
Medication Instructions:  Your physician recommends that you continue on your current medications as directed. Please refer to the Current Medication list given to you today.  *If you need a refill on your cardiac medications before your next appointment, please call your pharmacy*   Lab Work: NONE If you have labs (blood work) drawn today and your tests are completely normal, you will receive your results only by: [place] (if you have MyChart) OR A paper copy in the mail If you have any lab test that is abnormal or we need to change your treatment, we will call you to review the results.   Testing/Procedures: NONE   Follow-Up: At [facility], you and your health needs are our priority.  As part of our continuing mission to provide you with exceptional heart care, we have created designated Provider Care Teams.  These Care Teams include your primary Cardiologist (physician) and Advanced Practice Providers (APPs -  Physician Assistants and Nurse Practitioners) who all work together to provide you with the care you need, when you need it.   Your next appointment:   1 year(s)  Provider:   Jenkins Rouge, MD

## 2023-04-26 IMAGING — DX DG CHEST 1V PORT
1 series · 2 of 2 positions shown · non-contrast
Comparison: None.

CLINICAL DATA: Palpitations

EXAM:
PORTABLE CHEST 1 VIEW

[Series 1: chest · 0.14mm/px · 2 of 2 slices shown]
[im 1/2]
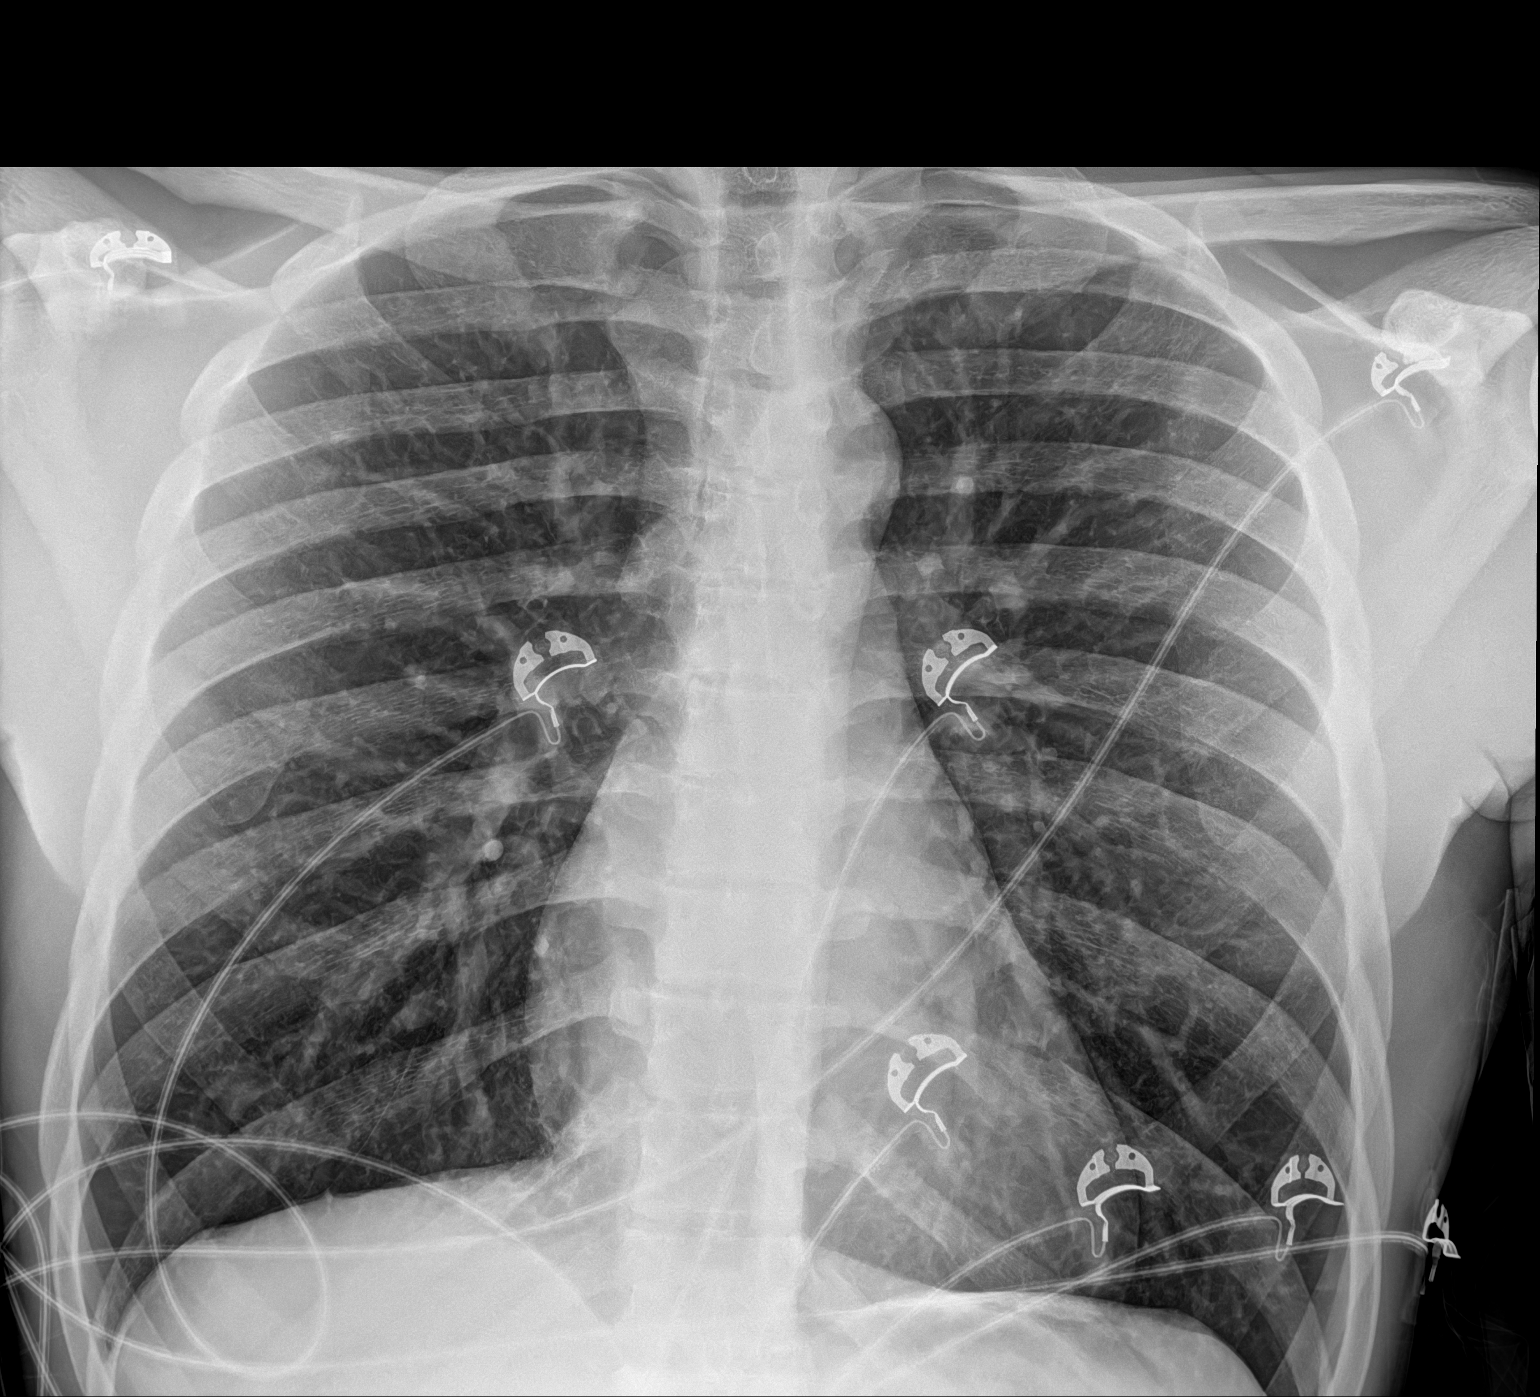
[im 2/2]
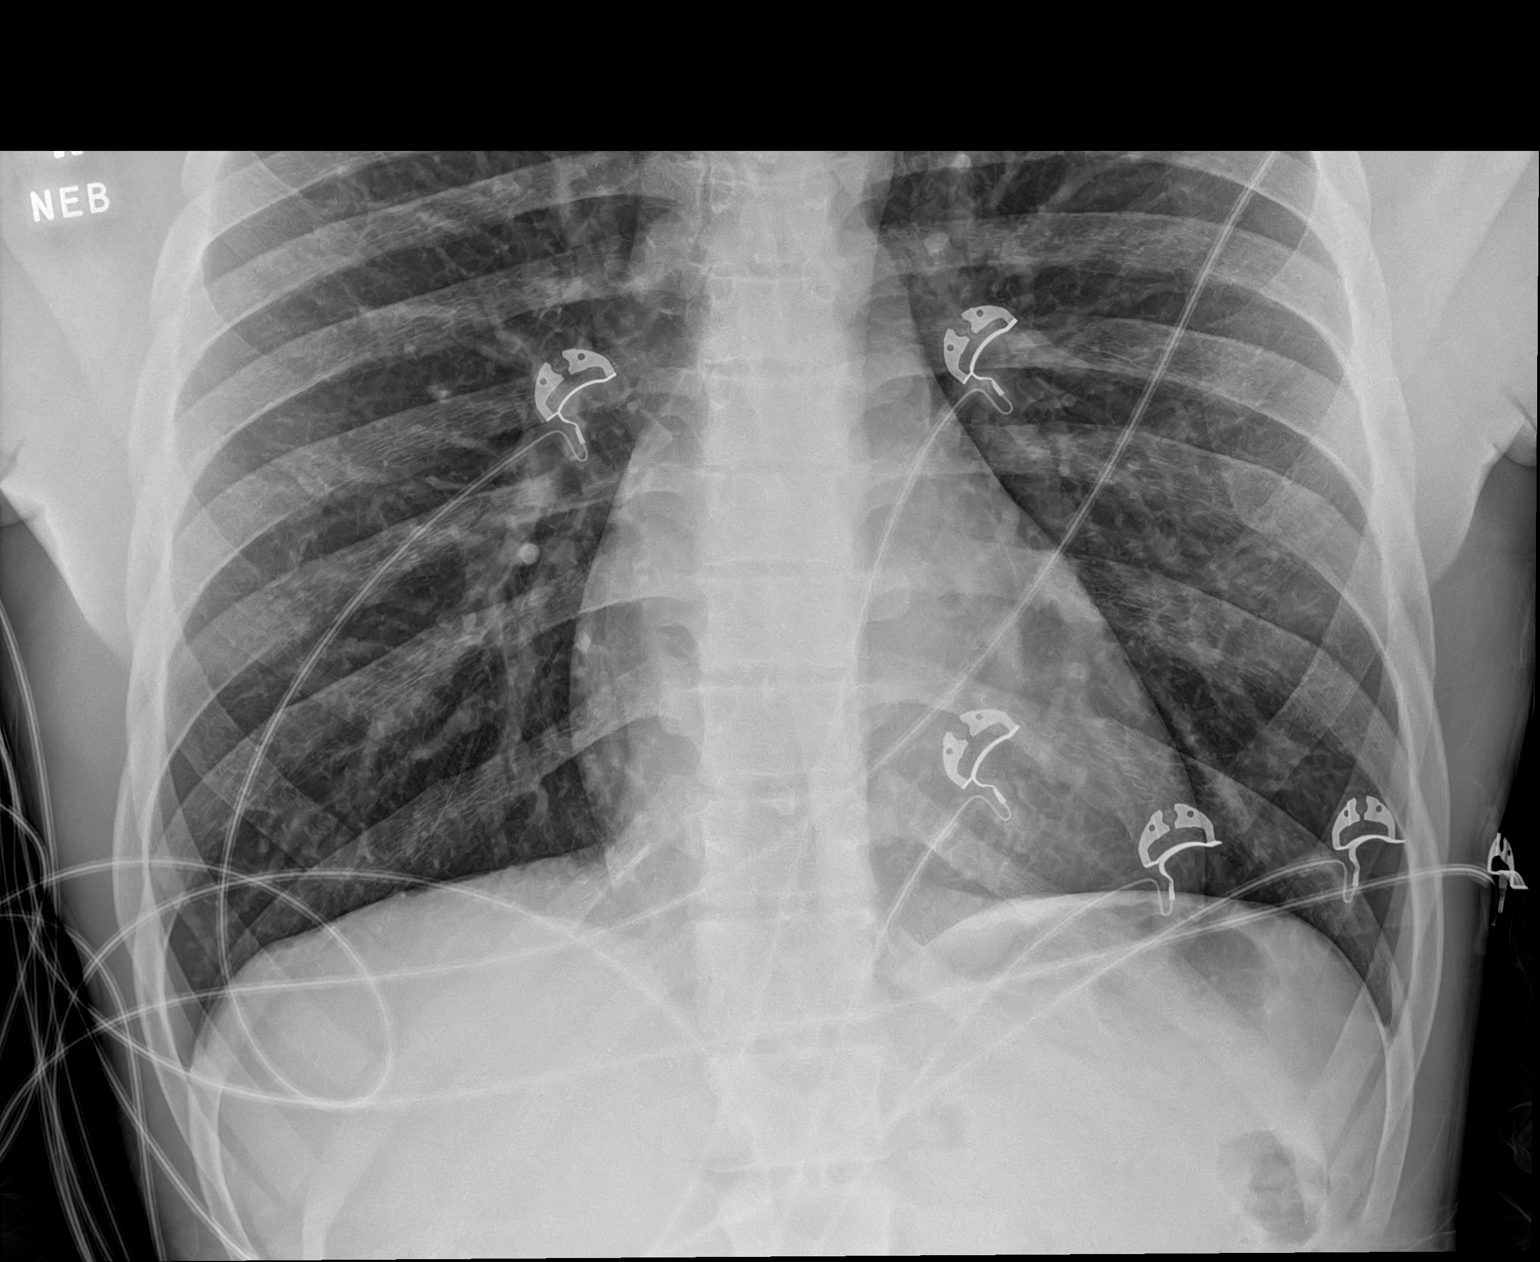

[2 of 2 positions shown; findings below may reference images not displayed]

FINDINGS: The heart size and mediastinal contours are within normal limits.
Both lungs are clear. The visualized skeletal structures are
unremarkable.
IMPRESSION: No active disease.

## 2023-12-18 ENCOUNTER — Ambulatory Visit
Admission: EM | Admit: 2023-12-18 | Discharge: 2023-12-18 | Disposition: A | Discharge status: Home / Self Care | Payer: Commercial Managed Care - HMO | Attending: Family Medicine | Admitting: Family Medicine

## 2023-12-18 DIAGNOSIS — L089 Local infection of the skin and subcutaneous tissue, unspecified: Secondary | ICD-10-CM | POA: Diagnosis not present

## 2023-12-18 DIAGNOSIS — L729 Follicular cyst of the skin and subcutaneous tissue, unspecified: Secondary | ICD-10-CM

## 2023-12-18 MED ORDER — DOXYCYCLINE HYCLATE 100 MG PO CAPS: 100.0000 mg | ORAL_CAPSULE | Freq: Two times a day (BID) | ORAL | 0 refills | Status: AC

## 2023-12-18 NOTE — ED Provider Notes (Signed)
[facility]  Note:  This document was prepared using Conservation officer, historic buildings and may include unintentional dictation errors.  MRN: [id number] DOB: [birth year]  Subjective:   Miguel Johnson is a 23 y.o. male presenting for 2 week history of persistent swelling, pain across his chest wall.  Has a history of a cyst.  Has previously resolved on its own.  Has not had to see his dermatologist.  No history of incision and drainage.  No current facility-administered medications for this encounter. No current outpatient medications on file.   No Known Allergies  Past Medical History:  Diagnosis Date   Abnormal EKG      Past Surgical History:  Procedure Laterality Date   KNEE ARTHROSCOPY WITH DRILLING/MICROFRACTURE Left 11/21/2018   Procedure: MICROFRACTURE;  Surgeon: Sheral Apley, MD;  Location: [facility];  Service: Orthopedics;  Laterality: Left;   KNEE ARTHROSCOPY WITH MENISCAL REPAIR Left 11/21/2018   Procedure: KNEE ARTHROSCOPY WITH MEDIAL MENISCAL REPAIR;  Surgeon: Sheral Apley, MD;  Location:  SUR[facility]ERY [facility];  Service: Orthopedics;  Laterality: Left;   ORCHIOPEXY Right 11/26/2018   Procedure: BILATERAL ORCHIOPEXY PEDIATRIC RIGHT TESTICULAR EXPLORATION;  Surgeon: Kandice Hams, MD;  Location: [facility];  Service: Pediatrics;  Laterality: Right;   SVT ABLATION N/A 09/10/2022   Procedure: SVT ABLATION;  Surgeon: Marinus Maw, MD;  [facility]: [facility] CV [facility];  Service: Cardiovascular;  Laterality: N/A;    Family History  Problem Relation Age of Onset   Heart murmur Maternal Grandfather    Heart attack Other     Social History   Tobacco Use   Smoking status: Never    Passive exposure: Never   Smokeless tobacco: Never  Vaping Use   Vaping status: Never Used  Substance Use Topics   Alcohol use: Never   Drug use: Not Currently    ROS   Objective:   Vitals: BP (!) 149/83 (BP Location: Left Arm)    Pulse 79   Temp 98.6 F (37 C) (Oral)   Resp 16   SpO2 97%   Physical Exam Constitutional:      General: He is not in acute distress.    Appearance: Normal appearance. He is well-developed and normal weight. He is not ill-appearing, toxic-appearing or diaphoretic.  HENT:     Head: Normocephalic and atraumatic.     Right Ear: External ear normal.     Left Ear: External ear normal.     Nose: Nose normal.     Mouth/Throat:     Pharynx: Oropharynx is clear.  Eyes:     General: No scleral icterus.       Right eye: No discharge.        Left eye: No discharge.     Extraocular Movements: Extraocular movements intact.  Cardiovascular:     Rate and Rhythm: Normal rate.  Pulmonary:     Effort: Pulmonary effort is normal.  Chest:    Musculoskeletal:     Cervical back: Normal range of motion.  Neurological:     Mental Status: He is alert and oriented to person, place, and time.  Psychiatric:        Mood and Affect: Mood normal.        Behavior: Behavior normal.        Thought Content: Thought content normal.        Judgment: Judgment normal.    PROCEDURE NOTE: I&D of Abscess Verbal consent obtained. Local anesthesia  with 3cc of 2% lidocaine with epinephrine. Site cleansed with alcohol swabs and Betadine swabs. Incision of ~1/4cm was made using an 11 blade, 4cc expressed consisting of a mixture of pus and serosanguinous fluid, cyst material. Wound cavity was explored with curved hemostats and loculations loosened. Cleansed and dressed.   Assessment and Plan :   PDMP not reviewed this encounter.  1. Infected cyst of skin    Successful I&D performed.  Wound care reviewed.  Start doxycycline for the abscess/infected cyst, ibuprofen for pain and inflammation.  Given the number of cysts on the chest wall, recommend consultation with dermatology.  Referral placed.  Counseled patient on potential for adverse effects with medications prescribed/recommended today, ER and return-to-clinic  precautions discussed, patient verbalized understanding.    Wallis Bamberg, [place] 12/18/23 [phone]

## 2023-12-18 NOTE — Discharge Instructions (Addendum)
Please change your dressing 3-5 times daily. Do not apply any ointments or creams. Each time you change your dressing, make sure that you are pressing on the wound to get pus to come out.  Try your best to clean the wound with antibacterial soap and warm water. Pat your wound dry and let it air out if possible to make sure it is dry before reapplying another dressing.   Start doxycycline for the infection. Use ibuprofen for the pain.   

## 2023-12-18 NOTE — ED Triage Notes (Signed)
Pt c/o reoccurring cyst to chest-currently 1-2 weeks-NAD-steady gait

## 2024-09-07 ENCOUNTER — Ambulatory Visit: Admitting: Physician Assistant

## 2024-09-07 ENCOUNTER — Encounter: Payer: Self-pay | Admitting: Physician Assistant

## 2024-09-07 VITALS — BP 143/88

## 2024-09-07 DIAGNOSIS — L700 Acne vulgaris: Secondary | ICD-10-CM | POA: Diagnosis not present

## 2024-09-07 MED ORDER — TRETINOIN 0.1 % EX CREA: TOPICAL_CREAM | Freq: Every day | CUTANEOUS | 6 refills | Status: AC

## 2024-09-07 NOTE — Patient Instructions (Signed)

## 2024-09-07 NOTE — Progress Notes (Signed)
   New Patient Visit   Subjective  Miguel Johnson is a 23 y.o. male NEW PATIENT who presents for the following: Cyst of chest that have been there about 2 years. He was on doxycycline  100 mg twice daily x 10 days in February. He also went to urgent care and had a bigger cyst drained. He has not had new cysts come up since then but still has some old cysts.    The following portions of the chart were reviewed this encounter and updated as appropriate: medications, allergies, medical history  Review of Systems:  No other skin or systemic complaints except as noted in HPI or Assessment and Plan.  Objective  Well appearing patient in no apparent distress; mood and affect are within normal limits.   A focused examination was performed of the following areas: Chest   Relevant exam findings are noted in the Assessment and Plan.    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  I, Roseline Hutchinson, CMA, am acting as scribe for Miguel Meras K, PA-C .   Documentation: I have reviewed the above documentation for accuracy and completeness, and I agree with the above.  Miguel Bergdoll K, PA-C
